# Patient Record
Sex: Female | Born: 1980 | Race: Black or African American | Hispanic: No | Marital: Single | Smoking: Current every day smoker
Health system: Southern US, Community
[De-identification: ages and names within clinical notes are randomized; demographics above are authoritative.]

## PROBLEM LIST (undated history)

## (undated) HISTORY — PX: OTHER SURGICAL HISTORY: SHX169

---
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
## 2000-04-15 ENCOUNTER — Emergency Department (HOSPITAL_COMMUNITY): Admission: EM | Admit: 2000-04-15 | Discharge: 2000-04-15 | Payer: Self-pay | Admitting: Emergency Medicine

## 2011-03-31 ENCOUNTER — Encounter: Payer: Self-pay | Admitting: Emergency Medicine

## 2011-03-31 ENCOUNTER — Emergency Department (HOSPITAL_BASED_OUTPATIENT_CLINIC_OR_DEPARTMENT_OTHER)
Admission: EM | Admit: 2011-03-31 | Discharge: 2011-03-31 | Disposition: A | Discharge status: Home / Self Care | Payer: Self-pay | Attending: Emergency Medicine | Admitting: Emergency Medicine

## 2011-03-31 DIAGNOSIS — N760 Acute vaginitis: Secondary | ICD-10-CM | POA: Insufficient documentation

## 2011-03-31 DIAGNOSIS — R109 Unspecified abdominal pain: Secondary | ICD-10-CM | POA: Insufficient documentation

## 2011-03-31 LAB — RPR: RPR Ser Ql: NONREACTIVE

## 2011-03-31 LAB — URINALYSIS, ROUTINE W REFLEX MICROSCOPIC
Glucose, UA: NEGATIVE mg/dL
Hgb urine dipstick: NEGATIVE
Ketones, ur: NEGATIVE mg/dL
Leukocytes, UA: NEGATIVE
Protein, ur: NEGATIVE mg/dL
Urobilinogen, UA: 0.2 mg/dL (ref 0.0–1.0)

## 2011-03-31 LAB — WET PREP, GENITAL: Yeast Wet Prep HPF POC: NONE SEEN

## 2011-03-31 MED ORDER — DOXYCYCLINE HYCLATE 100 MG PO CAPS: 100.0000 mg | ORAL_CAPSULE | Freq: Two times a day (BID) | ORAL | Status: AC

## 2011-03-31 MED ORDER — CEFTRIAXONE SODIUM 250 MG IJ SOLR
250.0000 mg | Freq: Once | INTRAMUSCULAR | Status: AC
  Administered 2011-03-31: 250 mg via INTRAMUSCULAR
  Filled 2011-03-31: qty 250

## 2011-03-31 MED ORDER — NAPROXEN 500 MG PO TABS: 500.0000 mg | ORAL_TABLET | Freq: Two times a day (BID) | ORAL | Status: AC

## 2011-03-31 MED ORDER — METRONIDAZOLE 500 MG PO TABS
2000.0000 mg | ORAL_TABLET | Freq: Once | ORAL | Status: AC
  Administered 2011-03-31: 2000 mg via ORAL
  Filled 2011-03-31: qty 4

## 2011-03-31 NOTE — ED Provider Notes (Addendum)
History     Chief Complaint  Patient presents with  . Abdominal Pain  . Vaginal Discharge   HPI Comments: The patient states she has had problems with recurrent vaginal discharge. She is seen in the health department in the past and they have told her to those related to either yeast infections or bacterial vaginosis. Patient comes in this evening because she is having some discharge again. She also has a swollen gland in the right inguinal region. Additionally she said a bit of a sore throat. Her boyfriend came into the emergency room this a.m. for sore throat symptoms as well  Patient is a 30 y.o. female presenting with abdominal pain and vaginal discharge. The history is provided by the patient.  Abdominal Pain The primary symptoms of the illness include abdominal pain and vaginal discharge. The primary symptoms of the illness do not include dysuria. The current episode started more than 2 days ago. The onset of the illness was gradual. The problem has not changed since onset. The vaginal discharge was first noticed more than 2 days ago. Vaginal discharge is a recurrent problem. The vaginal discharge is not associated with itching, burning or dysuria.  Symptoms associated with the illness do not include chills.  Vaginal Discharge Associated symptoms include abdominal pain.    No past medical history on file.  No past surgical history on file.  No family history on file.  History  Substance Use Topics  . Smoking status: Current Everyday Smoker  . Smokeless tobacco: Not on file  . Alcohol Use: No    OB History    Grav Para Term Preterm Abortions TAB SAB Ect Mult Living                  Review of Systems  Constitutional: Negative for chills and activity change.  HENT: Negative for congestion, facial swelling and neck stiffness.   Gastrointestinal: Positive for abdominal pain.  Genitourinary: Positive for vaginal discharge. Negative for dysuria, difficulty urinating and  genital sores.  Skin: Negative for itching.  All other systems reviewed and are negative.    Physical Exam  BP 124/80  Pulse 80  Temp(Src) 98 F (36.7 C) (Oral)  Resp 18  SpO2 99%  LMP 02/10/2011  Physical Exam  Constitutional: She appears well-developed and well-nourished. No distress.  HENT:  Head: Normocephalic and atraumatic.  Right Ear: External ear normal.  Left Ear: External ear normal.  Eyes: Conjunctivae are normal. Right eye exhibits no discharge. Left eye exhibits no discharge. No scleral icterus.  Neck: Neck supple. No tracheal deviation present.       Mild pharyngeal erythema no discharge  Cardiovascular: Normal rate, regular rhythm and intact distal pulses.   Pulmonary/Chest: Effort normal and breath sounds normal. No stridor. No respiratory distress. She has no wheezes. She has no rales.  Abdominal: Soft. Bowel sounds are normal. She exhibits no distension. There is no tenderness. There is no rebound and no guarding.  Genitourinary: Uterus normal. Guaiac stool: thick white discharge. Pelvic exam was performed with patient supine. There is no rash or tenderness on the right labia. There is no rash or tenderness on the left labia. No erythema or tenderness around the vagina. No foreign body around the vagina. No signs of injury around the vagina. Vaginal discharge found.  Musculoskeletal: She exhibits no edema and no tenderness.  Lymphadenopathy:    She has no cervical adenopathy.       Right: Inguinal adenopathy present.  Neurological: She is  alert. She has normal strength. No sensory deficit. Cranial nerve deficit:  no gross defecits noted. She exhibits normal muscle tone. She displays no seizure activity. Coordination normal.  Skin: Skin is warm and dry. No rash noted.  Psychiatric: She has a normal mood and affect.    ED Course  Procedures Labs Reviewed  WET PREP, GENITAL - Abnormal; Notable for the following:    Clue Cells, Wet Prep MANY (*)    WBC, Wet  Prep HPF POC FEW (*) BACTERIA- TOO NUMEROUS TO COUNT   All other components within normal limits  URINALYSIS, ROUTINE W REFLEX MICROSCOPIC  PREGNANCY, URINE  POCT PREGNANCY, URINE  GC/CHLAMYDIA PROBE AMP, GENITAL  RPR    MDM Patient has vaginal discharge most consistent with a vaginal candidiasis. Regarding her sore throat her friend had a sore throat last week and it has resolved. I suspect this might be a viral etiology. She does have mild inguinal lymphadenopathy on the right there are a few areas of healing hair follicles as the patient has shaved the hair in the genital region      Celene Kras, MD 03/31/11 1610  Celene Kras, MD 03/31/11 [phone]

## 2011-03-31 NOTE — ED Notes (Signed)
Pt c/o RLQ abd pain and thick white vaginal discharge.

## 2013-06-26 ENCOUNTER — Encounter (HOSPITAL_BASED_OUTPATIENT_CLINIC_OR_DEPARTMENT_OTHER): Payer: Self-pay | Admitting: Emergency Medicine

## 2013-06-26 ENCOUNTER — Emergency Department (HOSPITAL_BASED_OUTPATIENT_CLINIC_OR_DEPARTMENT_OTHER)
Admission: EM | Admit: 2013-06-26 | Discharge: 2013-06-26 | Disposition: A | Discharge status: Home / Self Care | Payer: Self-pay | Attending: Emergency Medicine | Admitting: Emergency Medicine

## 2013-06-26 DIAGNOSIS — Z3202 Encounter for pregnancy test, result negative: Secondary | ICD-10-CM | POA: Insufficient documentation

## 2013-06-26 DIAGNOSIS — N72 Inflammatory disease of cervix uteri: Secondary | ICD-10-CM | POA: Insufficient documentation

## 2013-06-26 DIAGNOSIS — F17200 Nicotine dependence, unspecified, uncomplicated: Secondary | ICD-10-CM | POA: Insufficient documentation

## 2013-06-26 LAB — URINALYSIS, ROUTINE W REFLEX MICROSCOPIC
Bilirubin Urine: NEGATIVE
Hgb urine dipstick: NEGATIVE
Ketones, ur: NEGATIVE mg/dL
Nitrite: NEGATIVE
Protein, ur: NEGATIVE mg/dL
Urobilinogen, UA: 1 mg/dL (ref 0.0–1.0)

## 2013-06-26 LAB — PREGNANCY, URINE: Preg Test, Ur: NEGATIVE

## 2013-06-26 MED ORDER — AZITHROMYCIN 250 MG PO TABS
1000.0000 mg | ORAL_TABLET | Freq: Once | ORAL | Status: AC
  Administered 2013-06-26: 1000 mg via ORAL
  Filled 2013-06-26: qty 4

## 2013-06-26 MED ORDER — IBUPROFEN 800 MG PO TABS: 800.0000 mg | ORAL_TABLET | Freq: Three times a day (TID) | ORAL | Status: DC

## 2013-06-26 MED ORDER — CEFTRIAXONE SODIUM 1 G IJ SOLR
1.0000 g | Freq: Once | INTRAMUSCULAR | Status: AC
  Administered 2013-06-26: 1 g via INTRAMUSCULAR
  Filled 2013-06-26: qty 10

## 2013-06-26 MED ORDER — LIDOCAINE HCL (PF) 1 % IJ SOLN
INTRAMUSCULAR | Status: AC
  Administered 2013-06-26: 2.1 mL
  Filled 2013-06-26: qty 5

## 2013-06-26 MED ORDER — TRAMADOL HCL 50 MG PO TABS: 50.0000 mg | ORAL_TABLET | Freq: Four times a day (QID) | ORAL | Status: DC | PRN

## 2013-06-26 NOTE — ED Notes (Signed)
Patient asked to provide clean catch urine sample & to change into gown.  Pelvic cart at bedside.

## 2013-06-26 NOTE — ED Provider Notes (Signed)
CSN: [id number]     Arrival date & time 06/26/13  1022 History   First MD Initiated Contact with Patient 06/26/13 1044     Chief Complaint  Patient presents with  . Abdominal Pain  . Vaginal Discharge   (Consider location/radiation/quality/duration/timing/severity/associated sxs/prior Treatment) HPI Comments: Patient presents to the ER for evaluation of moderate ower abdominal and pelvic pain, vaginal discharge and odor for 3 days. Patient reports that the pain is sharp, continuous. It worsens intermittently, but has not identified alleviating or exacerbating factors.  Patient is a 32 y.o. female presenting with abdominal pain and vaginal discharge.  Abdominal Pain Associated symptoms: vaginal discharge   Associated symptoms: no fever   Vaginal Discharge Associated symptoms: abdominal pain   Associated symptoms: no fever     History reviewed. No pertinent past medical history. History reviewed. No pertinent past surgical history. No family history on file. History  Substance Use Topics  . Smoking status: Current Every Day Smoker -- 0.50 packs/day    Types: Cigarettes  . Smokeless tobacco: Not on file  . Alcohol Use: Yes     Comment: occasional   OB History   Grav Para Term Preterm Abortions TAB SAB Ect Mult Living                 Review of Systems  Constitutional: Negative for fever.  Gastrointestinal: Positive for abdominal pain.  Genitourinary: Positive for vaginal discharge and pelvic pain.  All other systems reviewed and are negative.    Allergies  Iodine and Shellfish allergy  Home Medications  No current outpatient prescriptions on file. LMP 06/05/2013 Physical Exam  Constitutional: She is oriented to person, place, and time. She appears well-developed and well-nourished. No distress.  HENT:  Head: Normocephalic and atraumatic.  Right Ear: Hearing normal.  Left Ear: Hearing normal.  Nose: Nose normal.  Mouth/Throat: Oropharynx is clear and moist and  mucous membranes are normal.  Eyes: Conjunctivae and EOM are normal. Pupils are equal, round, and reactive to light.  Neck: Normal range of motion. Neck supple.  Cardiovascular: Regular rhythm, S1 normal and S2 normal.  Exam reveals no gallop and no friction rub.   No murmur heard. Pulmonary/Chest: Effort normal and breath sounds normal. No respiratory distress. She exhibits no tenderness.  Abdominal: Soft. Normal appearance and bowel sounds are normal. There is no hepatosplenomegaly. There is no tenderness. There is no rebound, no guarding, no tenderness at McBurney's point and negative Murphy's sign. No hernia.  Genitourinary: Vagina normal and uterus normal. Cervix exhibits motion tenderness. Right adnexum displays tenderness. Right adnexum displays no mass and no fullness. Left adnexum displays tenderness. Left adnexum displays no mass and no fullness.  Musculoskeletal: Normal range of motion.  Neurological: She is alert and oriented to person, place, and time. She has normal strength. No cranial nerve deficit or sensory deficit. Coordination normal. GCS eye subscore is 4. GCS verbal subscore is 5. GCS motor subscore is 6.  Skin: Skin is warm, dry and intact. No rash noted. No cyanosis.  Psychiatric: She has a normal mood and affect. Her speech is normal and behavior is normal. Thought content normal.    ED Course  Procedures (including critical care time) Labs Review Labs Reviewed  WET PREP, GENITAL - Abnormal; Notable for the following:    Clue Cells Wet Prep HPF POC MANY (*)    WBC, Wet Prep HPF POC FEW (*)    All other components within normal limits  URINALYSIS, ROUTINE W REFLEX MICROSCOPIC -  Abnormal; Notable for the following:    APPearance CLOUDY (*)    All other components within normal limits  GC/CHLAMYDIA PROBE AMP  PREGNANCY, URINE   Imaging Review No results found.  EKG Interpretation   None       MDM  Diagnosis: Cervicitis  Patient presents to the ER for  evaluation of pelvic pain and vaginal discharge. Abdominal exam is benign. No tenderness at McBurney point, no upper abdominal pain or tenderness. Pelvic exam reveals moderate cervical motion tenderness. Urinalysis unremarkable. Symptoms most consistent with cervicitis, possible pelvic inflammatory disease. Patient to be treated empirically, followup with gynecology.    Gilda Crease, MD 06/26/13 [phone]

## 2013-06-26 NOTE — ED Notes (Signed)
Pt reports vaginal d/c, odor and lower abdominal pain x 3 days

## 2013-06-27 LAB — GC/CHLAMYDIA PROBE AMP
CT Probe RNA: NEGATIVE
GC Probe RNA: NEGATIVE

## 2013-08-16 ENCOUNTER — Emergency Department (HOSPITAL_BASED_OUTPATIENT_CLINIC_OR_DEPARTMENT_OTHER)
Admission: EM | Admit: 2013-08-16 | Discharge: 2013-08-16 | Disposition: A | Discharge status: Home / Self Care | Payer: Self-pay | Attending: Emergency Medicine | Admitting: Emergency Medicine

## 2013-08-16 ENCOUNTER — Encounter (HOSPITAL_BASED_OUTPATIENT_CLINIC_OR_DEPARTMENT_OTHER): Payer: Self-pay | Admitting: Emergency Medicine

## 2013-08-16 DIAGNOSIS — Z3202 Encounter for pregnancy test, result negative: Secondary | ICD-10-CM | POA: Insufficient documentation

## 2013-08-16 DIAGNOSIS — B3731 Acute candidiasis of vulva and vagina: Secondary | ICD-10-CM | POA: Insufficient documentation

## 2013-08-16 DIAGNOSIS — A499 Bacterial infection, unspecified: Secondary | ICD-10-CM | POA: Insufficient documentation

## 2013-08-16 DIAGNOSIS — Z711 Person with feared health complaint in whom no diagnosis is made: Secondary | ICD-10-CM

## 2013-08-16 DIAGNOSIS — B9689 Other specified bacterial agents as the cause of diseases classified elsewhere: Secondary | ICD-10-CM | POA: Insufficient documentation

## 2013-08-16 DIAGNOSIS — N760 Acute vaginitis: Secondary | ICD-10-CM | POA: Insufficient documentation

## 2013-08-16 DIAGNOSIS — F17200 Nicotine dependence, unspecified, uncomplicated: Secondary | ICD-10-CM | POA: Insufficient documentation

## 2013-08-16 DIAGNOSIS — B373 Candidiasis of vulva and vagina: Secondary | ICD-10-CM

## 2013-08-16 LAB — URINALYSIS, ROUTINE W REFLEX MICROSCOPIC
Bilirubin Urine: NEGATIVE
Nitrite: NEGATIVE
Specific Gravity, Urine: 1.025 (ref 1.005–1.030)
Urobilinogen, UA: 1 mg/dL (ref 0.0–1.0)
pH: 6.5 (ref 5.0–8.0)

## 2013-08-16 LAB — PREGNANCY, URINE: Preg Test, Ur: NEGATIVE

## 2013-08-16 LAB — WET PREP, GENITAL: Trich, Wet Prep: NONE SEEN

## 2013-08-16 LAB — URINE MICROSCOPIC-ADD ON

## 2013-08-16 MED ORDER — FLUCONAZOLE 100 MG PO TABS
150.0000 mg | ORAL_TABLET | Freq: Once | ORAL | Status: AC
  Administered 2013-08-16: 150 mg via ORAL
  Filled 2013-08-16 (×2): qty 1

## 2013-08-16 MED ORDER — AZITHROMYCIN 250 MG PO TABS
1000.0000 mg | ORAL_TABLET | Freq: Once | ORAL | Status: AC
  Administered 2013-08-16: 1000 mg via ORAL
  Filled 2013-08-16: qty 4

## 2013-08-16 MED ORDER — METRONIDAZOLE 500 MG PO TABS: 500.0000 mg | ORAL_TABLET | Freq: Two times a day (BID) | ORAL | Status: DC

## 2013-08-16 MED ORDER — CEFTRIAXONE SODIUM 250 MG IJ SOLR
250.0000 mg | Freq: Once | INTRAMUSCULAR | Status: AC
  Administered 2013-08-16: 250 mg via INTRAMUSCULAR
  Filled 2013-08-16: qty 250

## 2013-08-16 NOTE — ED Notes (Signed)
Pt refused blood work. Hope Neese at bedside.

## 2013-08-16 NOTE — ED Provider Notes (Signed)
CSN: [id number]     Arrival date & time 08/16/13  1705 History   First MD Initiated Contact with Patient 08/16/13 1802     Chief Complaint  Patient presents with  . Vaginal Discharge  . Abdominal Pain   (Consider location/radiation/quality/duration/timing/severity/associated sxs/prior Treatment) Patient is a 32 y.o. female presenting with vaginal discharge and abdominal pain. The history is provided by the patient.  Vaginal Discharge Quality:  White and malodorous Severity:  Moderate Onset quality:  Gradual Duration:  4 days Timing:  Constant Progression:  Worsening Chronicity:  New Context: spontaneously   Relieved by:  Nothing Associated symptoms: abdominal pain, dysuria and urinary frequency   Associated symptoms: no dyspareunia, no fever, no nausea, no urinary incontinence and no vomiting   Abdominal Pain Associated symptoms: dysuria and vaginal discharge   Associated symptoms: no chest pain, no chills, no fever, no nausea, no shortness of breath, no sore throat and no vomiting    Denise Tran is a 32 y.o. female who presents to the ED with vaginal discharge and dysuria. She also complains of suprapubic pain. She is sexually active and uses condom for birth control. She is afraid she may have an STD. She has been with her current sex partner x 1 year. She has had trichomonas in the past.  G2 P0, SAB x 2. Last pap smear less than one year ago and was normal.   No past medical history on file. No past surgical history on file. No family history on file. History  Substance Use Topics  . Smoking status: Current Every Day Smoker -- 0.50 packs/day    Types: Cigarettes  . Smokeless tobacco: Not on file  . Alcohol Use: Yes     Comment: occasional   OB History   Grav Para Term Preterm Abortions TAB SAB Ect Mult Living                 Review of Systems  Constitutional: Negative for fever and chills.  HENT: Negative for congestion, ear pain, sore throat and trouble  swallowing.   Respiratory: Negative for shortness of breath.   Cardiovascular: Negative for chest pain.  Gastrointestinal: Positive for abdominal pain. Negative for nausea and vomiting.  Genitourinary: Positive for dysuria, urgency, frequency and vaginal discharge. Negative for bladder incontinence and dyspareunia.  Musculoskeletal: Negative for myalgias.  Skin: Negative for rash.  Allergic/Immunologic: Negative for immunocompromised state.  Neurological: Negative for headaches.  Psychiatric/Behavioral: Negative for confusion. The patient is not nervous/anxious.     Allergies  Iodine and Shellfish allergy  Home Medications  No current outpatient prescriptions on file. BP 124/77  Pulse 80  Temp(Src) 98.4 F (36.9 C) (Oral)  Resp 16  Ht 5\' 5"  (1.651 m)  Wt 180 lb (81.647 kg)  BMI 29.95 kg/m2  SpO2 100%  LMP 08/08/2013 Physical Exam  Nursing note and vitals reviewed. Constitutional: She is oriented to person, place, and time. She appears well-developed and well-nourished. No distress.  HENT:  Head: Normocephalic.  Eyes: EOM are normal.  Neck: Neck supple.  Cardiovascular: Normal rate and regular rhythm.   Pulmonary/Chest: Effort normal and breath sounds normal.  Abdominal: Soft. Bowel sounds are normal. There is no tenderness.  Genitourinary:  External genitalia with erythema consistent with yeast. Frothy malodorous discharge vaginal vault. No CMT, no adnexal tenderness, uterus without palpable enlargement.   Musculoskeletal: Normal range of motion.  Neurological: She is alert and oriented to person, place, and time. No cranial nerve deficit.  Skin: Skin is warm  and dry.  Psychiatric: She has a normal mood and affect. Her behavior is normal.   Results for orders placed during the hospital encounter of 08/16/13 (from the past 24 hour(s))  URINALYSIS, ROUTINE W REFLEX MICROSCOPIC     Status: Abnormal   Collection Time    08/16/13  5:22 PM      Result Value Range   Color,  Urine YELLOW  YELLOW   APPearance CLEAR  CLEAR   Specific Gravity, Urine 1.025  1.005 - 1.030   pH 6.5  5.0 - 8.0   Glucose, UA NEGATIVE  NEGATIVE mg/dL   Hgb urine dipstick NEGATIVE  NEGATIVE   Bilirubin Urine NEGATIVE  NEGATIVE   Ketones, ur NEGATIVE  NEGATIVE mg/dL   Protein, ur NEGATIVE  NEGATIVE mg/dL   Urobilinogen, UA 1.0  0.0 - 1.0 mg/dL   Nitrite NEGATIVE  NEGATIVE   Leukocytes, UA SMALL (*) NEGATIVE  PREGNANCY, URINE     Status: None   Collection Time    08/16/13  5:22 PM      Result Value Range   Preg Test, Ur NEGATIVE  NEGATIVE  URINE MICROSCOPIC-ADD ON     Status: Abnormal   Collection Time    08/16/13  5:22 PM      Result Value Range   Squamous Epithelial / LPF MANY (*) RARE   WBC, UA 0-2  <3 WBC/hpf   Bacteria, UA MANY (*) RARE   Urine-Other MUCOUS PRESENT    WET PREP, GENITAL     Status: Abnormal   Collection Time    08/16/13  6:53 PM      Result Value Range   Yeast Wet Prep HPF POC FEW (*) NONE SEEN   Trich, Wet Prep NONE SEEN  NONE SEEN   Clue Cells Wet Prep HPF POC MODERATE (*) NONE SEEN   WBC, Wet Prep HPF POC MODERATE (*) NONE SEEN    ED Course  Procedures  Patient refuses blood draw.   MDM  32 y.o. female with vaginal discharge, itching and burning. Concerns for possible STD's. Cultures for GC, Chlamydia and HSV sent. Patient stable for discharge without any immediate complications. I have reviewed this patient's vital signs, nurses notes, appropriate labs and imaging.  I have discussed findings with the patient and need for follow up. She voices understanding.    Medication List         metroNIDAZOLE 500 MG tablet  Commonly known as:  FLAGYL  Take 1 tablet (500 mg total) by mouth 2 (two) times daily.           [address] [place], [place] 08/17/13 [phone]

## 2013-08-16 NOTE — ED Notes (Signed)
Pt having lower abdominal pain, vaginal discharge and burning sensation x 3 days.  Pt states she has bumps on her vagina.

## 2013-08-16 NOTE — ED Notes (Signed)
rx x 1 for flagyl given with instructions for use

## 2013-08-18 LAB — GC/CHLAMYDIA PROBE AMP: CT Probe RNA: NEGATIVE

## 2013-08-18 LAB — HERPES SIMPLEX VIRUS CULTURE

## 2013-08-20 ENCOUNTER — Telehealth (HOSPITAL_BASED_OUTPATIENT_CLINIC_OR_DEPARTMENT_OTHER): Payer: Self-pay | Admitting: *Deleted

## 2013-08-20 NOTE — ED Notes (Signed)
Patient returned call, requesting flagyl vaginal gel in place of flagyl pills.  Chart reviewed with Dr. Fredderick Phenix.  Order received for Meto Gel Vagina, 1 applicator to vagina two times per day.  No refills.  Called to [facility] , [place].

## 2013-08-25 NOTE — ED Provider Notes (Signed)
Medical screening examination/treatment/procedure(s) were performed by non-physician practitioner and as supervising physician I was immediately available for consultation/collaboration.  EKG Interpretation   None         Hayzen Lorenson J Shantel Helwig, MD 08/25/13 2312 

## 2014-02-15 ENCOUNTER — Encounter (HOSPITAL_BASED_OUTPATIENT_CLINIC_OR_DEPARTMENT_OTHER): Payer: Self-pay | Admitting: Emergency Medicine

## 2014-02-15 ENCOUNTER — Emergency Department (HOSPITAL_BASED_OUTPATIENT_CLINIC_OR_DEPARTMENT_OTHER)
Admission: EM | Admit: 2014-02-15 | Discharge: 2014-02-15 | Disposition: A | Discharge status: Home / Self Care | Payer: Self-pay | Attending: Emergency Medicine | Admitting: Emergency Medicine

## 2014-02-15 DIAGNOSIS — F17200 Nicotine dependence, unspecified, uncomplicated: Secondary | ICD-10-CM | POA: Insufficient documentation

## 2014-02-15 DIAGNOSIS — Z3202 Encounter for pregnancy test, result negative: Secondary | ICD-10-CM | POA: Insufficient documentation

## 2014-02-15 DIAGNOSIS — R21 Rash and other nonspecific skin eruption: Secondary | ICD-10-CM | POA: Insufficient documentation

## 2014-02-15 DIAGNOSIS — N949 Unspecified condition associated with female genital organs and menstrual cycle: Secondary | ICD-10-CM | POA: Insufficient documentation

## 2014-02-15 DIAGNOSIS — N898 Other specified noninflammatory disorders of vagina: Secondary | ICD-10-CM | POA: Insufficient documentation

## 2014-02-15 DIAGNOSIS — R102 Pelvic and perineal pain: Secondary | ICD-10-CM

## 2014-02-15 LAB — URINALYSIS, ROUTINE W REFLEX MICROSCOPIC
Bilirubin Urine: NEGATIVE
Glucose, UA: NEGATIVE mg/dL
Hgb urine dipstick: NEGATIVE
Ketones, ur: NEGATIVE mg/dL
LEUKOCYTES UA: NEGATIVE
Nitrite: NEGATIVE
PH: 7.5 (ref 5.0–8.0)
Protein, ur: NEGATIVE mg/dL
Specific Gravity, Urine: 1.019 (ref 1.005–1.030)
UROBILINOGEN UA: 0.2 mg/dL (ref 0.0–1.0)

## 2014-02-15 LAB — WET PREP, GENITAL
Trich, Wet Prep: NONE SEEN
WBC, Wet Prep HPF POC: NONE SEEN
Yeast Wet Prep HPF POC: NONE SEEN

## 2014-02-15 LAB — PREGNANCY, URINE: PREG TEST UR: NEGATIVE

## 2014-02-15 LAB — RPR

## 2014-02-15 LAB — HIV ANTIBODY (ROUTINE TESTING W REFLEX): HIV: NONREACTIVE

## 2014-02-15 MED ORDER — AZITHROMYCIN 250 MG PO TABS
1000.0000 mg | ORAL_TABLET | Freq: Once | ORAL | Status: AC
  Administered 2014-02-15: 1000 mg via ORAL
  Filled 2014-02-15: qty 4

## 2014-02-15 MED ORDER — LIDOCAINE HCL (PF) 1 % IJ SOLN
INTRAMUSCULAR | Status: AC
  Administered 2014-02-15: 1.2 mL
  Filled 2014-02-15: qty 5

## 2014-02-15 MED ORDER — CEFTRIAXONE SODIUM 250 MG IJ SOLR
250.0000 mg | Freq: Once | INTRAMUSCULAR | Status: AC
  Administered 2014-02-15: 250 mg via INTRAMUSCULAR
  Filled 2014-02-15: qty 250

## 2014-02-15 NOTE — ED Notes (Signed)
Patient here with intermittent lower abdominal cramping x 3 days.  Reports vaginal discharge and also concerned that she has noticed some bumps on her buttocks. No nausea, no vomiting, no urinary symptoms

## 2014-02-15 NOTE — ED Notes (Signed)
Pt told charge nurse that she was going out to the waiting room to make sure her ride didn't leave and she was told she had to come back.  Pt left the Ed and then called back here for her discharge instructions.  Pt was told that she is not up for discharge yet so we do not know what her instructions will be or if there will be any prescriptions.  She sts that she will come back.  MD notified.

## 2014-02-15 NOTE — ED Provider Notes (Signed)
CSN: [id number]     Arrival date & time 02/15/14  2992 History   First MD Initiated Contact with Patient 02/15/14 1022     Chief Complaint  Patient presents with  . Abdominal Cramping     (Consider location/radiation/quality/duration/timing/severity/associated sxs/prior Treatment) HPI Comments: Pt comes in with cc of intermittent lower abd crampy pain. Pain started 3 days ago, and has no specific aggravating or relieving factors. + vaginal discharge and unprotected intercourse. No bleeding. Pt also has some bumps around her vagina and buttocks. No pain, no drainage or crusting.  Patient is a 33 y.o. female presenting with cramps. The history is provided by the patient.  Abdominal Cramping Associated symptoms: vaginal discharge   Associated symptoms: no chest pain, no dysuria, no nausea, no shortness of breath, no vaginal bleeding and no vomiting     History reviewed. No pertinent past medical history. History reviewed. No pertinent past surgical history. No family history on file. History  Substance Use Topics  . Smoking status: Current Every Day Smoker -- 0.50 packs/day    Types: Cigarettes  . Smokeless tobacco: Not on file  . Alcohol Use: Yes     Comment: occasional   OB History   Grav Para Term Preterm Abortions TAB SAB Ect Mult Living                 Review of Systems  Constitutional: Negative for activity change.  Respiratory: Negative for shortness of breath.   Cardiovascular: Negative for chest pain.  Gastrointestinal: Negative for nausea, vomiting and abdominal pain.  Genitourinary: Positive for vaginal discharge and pelvic pain. Negative for dysuria and vaginal bleeding.  Musculoskeletal: Negative for neck pain.  Skin: Positive for rash.  Neurological: Negative for headaches.      Allergies  Iodine and Shellfish allergy  Home Medications   Prior to Admission medications   Not on File   BP 143/96  Pulse 78  Temp(Src) 98 F (36.7 C)  Resp 20  Wt 178  lb (80.74 kg)  SpO2 100%  LMP 02/05/2014 Physical Exam  Nursing note and vitals reviewed. Constitutional: She is oriented to person, place, and time. She appears well-developed and well-nourished.  HENT:  Head: Normocephalic and atraumatic.  Eyes: Conjunctivae and EOM are normal. Pupils are equal, round, and reactive to light.  Neck: Normal range of motion. Neck supple.  Cardiovascular: Normal rate, regular rhythm, normal heart sounds and intact distal pulses.   No murmur heard. Pulmonary/Chest: Effort normal. No respiratory distress. She has no wheezes.  Abdominal: Soft. Bowel sounds are normal. She exhibits no distension. There is no tenderness. There is no rebound and no guarding.  Genitourinary: Vagina normal and uterus normal.  External exam - normal, no lesions Speculum exam: Pt has some white discharge, no blood Bimanual exam: Patient has no CMT, no adnexal tenderness or fullness and cervical os is closed  Neurological: She is alert and oriented to person, place, and time.  Skin: Skin is warm and dry. Rash noted.  Pt has diffuse skin lesion - wart like, or skin tag like. Painless, no bleeding/drainage.    ED Course  Procedures (including critical care time) Labs Review Labs Reviewed  WET PREP, GENITAL - Abnormal; Notable for the following:    Clue Cells Wet Prep HPF POC FEW (*)    All other components within normal limits  GC/CHLAMYDIA PROBE AMP  URINALYSIS, ROUTINE W REFLEX MICROSCOPIC  PREGNANCY, URINE  RPR  HIV ANTIBODY (ROUTINE TESTING)    Imaging Review  No results found.   EKG Interpretation None      MDM   Final diagnoses:  Pelvic pain  Skin rash in pelvic region   Pt comes in with cc of pelvic pain. GC and chlamydia tx given empirically given the discharge and high risk for STD. Abd exam is non tender, pelvic pain is intermittent.  Pt also has these lesions around the vulva and near her perineal organs. Painless. RPR tested. Hands are lesion  free. Gyne follow up requested.     Derwood KaplanAnkit Hurshel Bouillon, MD 02/15/14 1339

## 2014-02-15 NOTE — Discharge Instructions (Signed)
Please see the gynecologist for your lesions in the pelvic region. We are not sure what they are - we have tested you for a couple of other conditions - and the hospital will call you if the results are positive.  Pelvic Pain, Female Female pelvic pain can be caused by many different things and start from a variety of places. Pelvic pain refers to pain that is located in the lower half of the abdomen and between your hips. The pain may occur over a short period of time (acute) or may be reoccurring (chronic). The cause of pelvic pain may be related to disorders affecting the female reproductive organs (gynecologic), but it may also be related to the bladder, kidney stones, an intestinal complication, or muscle or skeletal problems. Getting help right away for pelvic pain is important, especially if there has been severe, sharp, or a sudden onset of unusual pain. It is also important to get help right away because some types of pelvic pain can be life threatening.  CAUSES  Below are only some of the causes of pelvic pain. The causes of pelvic pain can be in one of several categories.   Gynecologic.  Pelvic inflammatory disease.  Sexually transmitted infection.  Ovarian cyst or a twisted ovarian ligament (ovarian torsion).  Uterine lining that grows outside the uterus (endometriosis).  Fibroids, cysts, or tumors.  Ovulation.  Pregnancy.  Pregnancy that occurs outside the uterus (ectopic pregnancy).  Miscarriage.  Labor.  Abruption of the placenta or ruptured uterus.  Infection.  Uterine infection (endometritis).  Bladder infection.  Diverticulitis.  Miscarriage related to a uterine infection (septic abortion).  Bladder.  Inflammation of the bladder (cystitis).  Kidney stone(s).  Gastrointenstinal.  Constipation.  Diverticulitis.  Neurologic.  Trauma.  Feeling pelvic pain because of mental or emotional causes (psychosomatic).  Cancers of the bowel or  pelvis. EVALUATION  Your caregiver will want to take a careful history of your concerns. This includes recent changes in your health, a careful gynecologic history of your periods (menses), and a sexual history. Obtaining your family history and medical history is also important. Your caregiver may suggest a pelvic exam. A pelvic exam will help identify the location and severity of the pain. It also helps in the evaluation of which organ system may be involved. In order to identify the cause of the pelvic pain and be properly treated, your caregiver may order tests. These tests may include:   A pregnancy test.  Pelvic ultrasonography.  An X-ray exam of the abdomen.  A urinalysis or evaluation of vaginal discharge.  Blood tests. HOME CARE INSTRUCTIONS   Only take over-the-counter or prescription medicines for pain, discomfort, or fever as directed by your caregiver.   Rest as directed by your caregiver.   Eat a balanced diet.   Drink enough fluids to make your urine clear or pale yellow, or as directed.   Avoid sexual intercourse if it causes pain.   Apply warm or cold compresses to the lower abdomen depending on which one helps the pain.   Avoid stressful situations.   Keep a journal of your pelvic pain. Write down when it started, where the pain is located, and if there are things that seem to be associated with the pain, such as food or your menstrual cycle.  Follow up with your caregiver as directed.  SEEK MEDICAL CARE IF:  Your medicine does not help your pain.  You have abnormal vaginal discharge. SEEK IMMEDIATE MEDICAL CARE IF:  You have heavy bleeding from the vagina.   Your pelvic pain increases.   You feel lightheaded or faint.   You have chills.   You have pain with urination or blood in your urine.   You have uncontrolled diarrhea or vomiting.   You have a fever or persistent symptoms for more than 3 days.  You have a fever and your  symptoms suddenly get worse.   You are being physically or sexually abused.  MAKE SURE YOU:  Understand these instructions.  Will watch your condition.  Will get help if you are not doing well or get worse. Document Released: 07/25/2004 Document Revised: 02/27/2012 Document Reviewed: 12/18/2011 [facility] Patient Information 2014 [place], [place].

## 2014-02-16 LAB — GC/CHLAMYDIA PROBE AMP
CT Probe RNA: NEGATIVE
GC PROBE AMP APTIMA: NEGATIVE

## 2015-04-22 ENCOUNTER — Emergency Department (HOSPITAL_BASED_OUTPATIENT_CLINIC_OR_DEPARTMENT_OTHER)
Admission: EM | Admit: 2015-04-22 | Discharge: 2015-04-22 | Disposition: A | Discharge status: Home / Self Care | Payer: Self-pay | Attending: Emergency Medicine | Admitting: Emergency Medicine

## 2015-04-22 ENCOUNTER — Encounter (HOSPITAL_BASED_OUTPATIENT_CLINIC_OR_DEPARTMENT_OTHER): Payer: Self-pay | Admitting: Emergency Medicine

## 2015-04-22 DIAGNOSIS — R238 Other skin changes: Secondary | ICD-10-CM | POA: Insufficient documentation

## 2015-04-22 DIAGNOSIS — Z720 Tobacco use: Secondary | ICD-10-CM | POA: Insufficient documentation

## 2015-04-22 DIAGNOSIS — R51 Headache: Secondary | ICD-10-CM | POA: Insufficient documentation

## 2015-04-22 NOTE — ED Notes (Signed)
Patient preparing for discharge. 

## 2015-04-22 NOTE — ED Provider Notes (Signed)
CSN: [id number]     Arrival date & time 04/22/15  4098 History   First MD Initiated Contact with Patient 04/22/15 1014     Chief Complaint  Patient presents with  . Abscess     (Consider location/radiation/quality/duration/timing/severity/associated sxs/prior Treatment) Patient is a 34 y.o. female presenting with abscess.  Abscess Location:  Face Facial abscess location: Right temple. Size:  2mm Abscess quality: painful   Abscess quality: not draining, no fluctuance and no itching   Red streaking: no   Duration:  8 months Progression:  Unchanged Pain details:    Severity:  Moderate   Timing:  Constant   Progression:  Worsening Context comment:  Patient attempted to pop the lesion. Relieved by:  Nothing Worsened by:  Draining/squeezing (Manipulating the lesion) Associated symptoms: headaches (Associated with trying to squeeze the lesion)   Associated symptoms: no fever     History reviewed. No pertinent past medical history. Past Surgical History  Procedure Laterality Date  . Cyst removed from breast     No family history on file. Social History  Substance Use Topics  . Smoking status: Current Every Day Smoker -- 0.50 packs/day    Types: Cigarettes  . Smokeless tobacco: None  . Alcohol Use: No     Comment: occasional   OB History    No data available     Review of Systems  Constitutional: Negative for fever.  Neurological: Positive for headaches (Associated with trying to squeeze the lesion).  All other systems reviewed and are negative.     Allergies  Iodine and Shellfish allergy  Home Medications   Prior to Admission medications   Not on File   BP 115/65 mmHg  Pulse 90  Temp(Src) 98.1 F (36.7 C) (Oral)  Resp 18  Ht  (1.651 m)  Wt 165 lb (74.844 kg)  BMI 27.46 kg/m2  SpO2 100% Physical Exam  Constitutional: She is oriented to person, place, and time. She appears well-developed and well-nourished. No distress.  HENT:  Head:  Normocephalic and atraumatic.    Eyes: Conjunctivae are normal. No scleral icterus.  Neck: Neck supple.  Cardiovascular: Normal rate and intact distal pulses.   Pulmonary/Chest: Effort normal. No stridor. No respiratory distress.  Abdominal: Normal appearance. She exhibits no distension.  Neurological: She is alert and oriented to person, place, and time.  Skin: Skin is warm and dry. No rash noted.  Psychiatric: She has a normal mood and affect. Her behavior is normal.  Nursing note and vitals reviewed.   ED Course  Procedures (including critical care time) Labs Review Labs Reviewed - No data to display  Imaging Review No results found.   EKG Interpretation None      MDM   Final diagnoses:  Papule    Small skin lesion. Recommended outpatient treatment.    Blake Divine, MD 04/22/15 1050

## 2015-04-22 NOTE — Discharge Instructions (Signed)
Use over-the-counter skin ointments. Do not attempt to bust or manipulate the lesion. Return if swelling worsens, you develop fevers, or other concerning symptoms develop.

## 2015-04-22 NOTE — ED Notes (Signed)
?   Small beginning of an abscess rt temporal area

## 2015-05-19 ENCOUNTER — Emergency Department (HOSPITAL_BASED_OUTPATIENT_CLINIC_OR_DEPARTMENT_OTHER)
Admission: EM | Admit: 2015-05-19 | Discharge: 2015-05-19 | Disposition: A | Discharge status: Home / Self Care | Payer: No Typology Code available for payment source | Attending: Emergency Medicine | Admitting: Emergency Medicine

## 2015-05-19 ENCOUNTER — Encounter (HOSPITAL_BASED_OUTPATIENT_CLINIC_OR_DEPARTMENT_OTHER): Payer: Self-pay | Admitting: Emergency Medicine

## 2015-05-19 DIAGNOSIS — Z720 Tobacco use: Secondary | ICD-10-CM | POA: Insufficient documentation

## 2015-05-19 DIAGNOSIS — S299XXA Unspecified injury of thorax, initial encounter: Secondary | ICD-10-CM | POA: Diagnosis not present

## 2015-05-19 DIAGNOSIS — Y9389 Activity, other specified: Secondary | ICD-10-CM | POA: Diagnosis not present

## 2015-05-19 DIAGNOSIS — Y998 Other external cause status: Secondary | ICD-10-CM | POA: Diagnosis not present

## 2015-05-19 DIAGNOSIS — S199XXA Unspecified injury of neck, initial encounter: Secondary | ICD-10-CM | POA: Diagnosis present

## 2015-05-19 DIAGNOSIS — R110 Nausea: Secondary | ICD-10-CM | POA: Insufficient documentation

## 2015-05-19 DIAGNOSIS — Y92481 Parking lot as the place of occurrence of the external cause: Secondary | ICD-10-CM | POA: Insufficient documentation

## 2015-05-19 MED ORDER — IBUPROFEN 600 MG PO TABS: 600.0000 mg | ORAL_TABLET | Freq: Four times a day (QID) | ORAL | Status: DC | PRN

## 2015-05-19 MED ORDER — DIAZEPAM 5 MG PO TABS: 5.0000 mg | ORAL_TABLET | Freq: Four times a day (QID) | ORAL | Status: DC | PRN
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
## 2015-05-19 NOTE — Discharge Instructions (Signed)
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
## 2015-06-29 ENCOUNTER — Emergency Department (HOSPITAL_BASED_OUTPATIENT_CLINIC_OR_DEPARTMENT_OTHER)
Admission: EM | Admit: 2015-06-29 | Discharge: 2015-06-30 | Disposition: A | Discharge status: Home / Self Care | Payer: Self-pay | Attending: Emergency Medicine | Admitting: Emergency Medicine

## 2015-06-29 ENCOUNTER — Encounter (HOSPITAL_BASED_OUTPATIENT_CLINIC_OR_DEPARTMENT_OTHER): Payer: Self-pay

## 2015-06-29 DIAGNOSIS — F17210 Nicotine dependence, cigarettes, uncomplicated: Secondary | ICD-10-CM | POA: Insufficient documentation

## 2015-06-29 DIAGNOSIS — N3001 Acute cystitis with hematuria: Secondary | ICD-10-CM | POA: Insufficient documentation

## 2015-06-29 DIAGNOSIS — Z3202 Encounter for pregnancy test, result negative: Secondary | ICD-10-CM | POA: Insufficient documentation

## 2015-06-29 DIAGNOSIS — R202 Paresthesia of skin: Secondary | ICD-10-CM | POA: Insufficient documentation

## 2015-06-29 LAB — URINALYSIS, ROUTINE W REFLEX MICROSCOPIC
BILIRUBIN URINE: NEGATIVE
Glucose, UA: NEGATIVE mg/dL
KETONES UR: NEGATIVE mg/dL
Nitrite: NEGATIVE
PH: 5.5 (ref 5.0–8.0)
Protein, ur: NEGATIVE mg/dL
Specific Gravity, Urine: 1.021 (ref 1.005–1.030)
UROBILINOGEN UA: 1 mg/dL (ref 0.0–1.0)

## 2015-06-29 LAB — URINE MICROSCOPIC-ADD ON

## 2015-06-29 LAB — PREGNANCY, URINE: Preg Test, Ur: NEGATIVE

## 2015-06-29 MED ORDER — IBUPROFEN 800 MG PO TABS: 800.0000 mg | ORAL_TABLET | Freq: Three times a day (TID) | ORAL | Status: DC | PRN

## 2015-06-29 MED ORDER — NITROFURANTOIN MONOHYD MACRO 100 MG PO CAPS: 100.0000 mg | ORAL_CAPSULE | Freq: Two times a day (BID) | ORAL | Status: DC

## 2015-06-29 MED ORDER — IBUPROFEN 800 MG PO TABS
800.0000 mg | ORAL_TABLET | Freq: Once | ORAL | Status: AC
  Administered 2015-06-29: 800 mg via ORAL
  Filled 2015-06-29: qty 1

## 2015-06-29 MED ORDER — NITROFURANTOIN MONOHYD MACRO 100 MG PO CAPS
100.0000 mg | ORAL_CAPSULE | Freq: Once | ORAL | Status: AC
  Administered 2015-06-29: 100 mg via ORAL
  Filled 2015-06-29: qty 1

## 2015-06-29 NOTE — Discharge Instructions (Signed)

## 2015-06-29 NOTE — ED Provider Notes (Signed)
CSN: [id number]     Arrival date & time 06/29/15  2201 History  By signing my name below, I, Gwenyth Oberatherine Macek, attest that this documentation has been prepared under the direction and in the presence of Paula LibraJohn Abdoulie Tierce, MD.  Electronically Signed: Gwenyth Oberatherine Macek, ED Scribe. 06/29/2015. 11:49 PM.   Chief Complaint  Patient presents with  . Hematuria   The history is provided by the patient. No language interpreter was used.    HPI Comments: Denise Tran is a 34 y.o. female who presents to the Emergency Department complaining of intermittent, mild hematuria that started 2-3 days ago. She has associated tingling with urination and mild suprapubic pain as associated symptoms. Pt denies vaginal bleeding, vaginal discharge, urinary frequency, back pain, vomiting, diarrhea, fever and chills.   History reviewed. No pertinent past medical history. Past Surgical History  Procedure Laterality Date  . Cyst removed from breast     No family history on file. Social History  Substance Use Topics  . Smoking status: Current Every Day Smoker -- 0.50 packs/day    Types: Cigarettes  . Smokeless tobacco: None  . Alcohol Use: No   OB History    No data available     Review of Systems 10 Systems reviewed and all are negative for acute change except as noted in the HPI.   Allergies  Iodine and Shellfish allergy  Home Medications   Prior to Admission medications   Medication Sig Start Date End Date Taking? Authorizing Provider  ibuprofen (ADVIL,MOTRIN) 800 MG tablet Take 1 tablet (800 mg total) by mouth every 8 (eight) hours as needed (for pain). 06/29/15   Aidian Salomon, MD  nitrofurantoin, macrocrystal-monohydrate, (MACROBID) 100 MG capsule Take 1 capsule (100 mg total) by mouth 2 (two) times daily. X 7 days 06/29/15   Paula LibraJohn Jasiyah Paulding, MD   BP 121/80 mmHg  Pulse 89  Temp(Src) 98.3 F (36.8 C) (Oral)  Resp 16  Ht 5\' 5"  (1.651 m)  Wt 165 lb (74.844 kg)  BMI 27.46 kg/m2  SpO2 100%  LMP 06/19/2015    Physical Exam General: Well-developed, well-nourished female in no acute distress; appearance consistent with age of record HENT: normocephalic; atraumatic Eyes: pupils equal, round and reactive to light; extraocular muscles intact Neck: supple Heart: regular rate and rhythm Lungs: clear to auscultation bilaterally Abdomen: soft; nondistended; mild suprapubic tenderness; no masses or hepatosplenomegaly; bowel sounds present Extremities: No deformity; full range of motion; pulses normal Neurologic: Awake, alert and oriented; motor function intact in all extremities and symmetric; no facial droop Skin: Warm and dry Psychiatric: Normal mood and affect  ED Course  Procedures  DIAGNOSTIC STUDIES: Oxygen Saturation is 100% on RA, normal by my interpretation.    COORDINATION OF CARE: 11:22 PM Discussed treatment plan with pt. Pt agreed to plan.    MDM   Nursing notes and vitals signs, including pulse oximetry, reviewed.  Summary of this visit's results, reviewed by myself:  Labs:  Results for orders placed or performed during the hospital encounter of 06/29/15 (from the past 24 hour(s))  Urinalysis, Routine w reflex microscopic ([facility])     Status: Abnormal   Collection Time: 06/29/15 10:05 PM  Result Value Ref Range   Color, Urine YELLOW YELLOW   APPearance CLEAR CLEAR   Specific Gravity, Urine 1.021 1.005 - 1.030   pH 5.5 5.0 - 8.0   Glucose, UA NEGATIVE NEGATIVE mg/dL   Hgb urine dipstick LARGE (A) NEGATIVE   Bilirubin Urine NEGATIVE NEGATIVE  Ketones, ur NEGATIVE NEGATIVE mg/dL   Protein, ur NEGATIVE NEGATIVE mg/dL   Urobilinogen, UA 1.0 0.0 - 1.0 mg/dL   Nitrite NEGATIVE NEGATIVE   Leukocytes, UA TRACE (A) NEGATIVE  Pregnancy, urine     Status: None   Collection Time: 06/29/15 10:05 PM  Result Value Ref Range   Preg Test, Ur NEGATIVE NEGATIVE  Urine microscopic-add on     Status: Abnormal   Collection Time: 06/29/15 10:05 PM  Result Value Ref Range    Squamous Epithelial / LPF FEW (A) RARE   WBC, UA 3-6 <3 WBC/hpf   RBC / HPF 11-20 <3 RBC/hpf   Bacteria, UA FEW (A) RARE   Urine-Other MUCOUS PRESENT    We'll treat for early hemorrhagic cystitis.   Final diagnoses:  Acute cystitis with hematuria   I personally performed the services described in this documentation, which was scribed in my presence. The recorded information has been reviewed and is accurate.   Paula Libra, MD 06/29/15 [phone]

## 2015-06-29 NOTE — ED Notes (Signed)
Hematuria x 2-3 days

## 2015-07-01 LAB — URINE CULTURE

## 2016-03-01 ENCOUNTER — Encounter (HOSPITAL_BASED_OUTPATIENT_CLINIC_OR_DEPARTMENT_OTHER): Payer: Self-pay | Admitting: Emergency Medicine

## 2016-03-01 ENCOUNTER — Emergency Department (HOSPITAL_BASED_OUTPATIENT_CLINIC_OR_DEPARTMENT_OTHER)
Admission: EM | Admit: 2016-03-01 | Discharge: 2016-03-01 | Disposition: A | Discharge status: Home / Self Care | Payer: No Typology Code available for payment source | Attending: Emergency Medicine | Admitting: Emergency Medicine

## 2016-03-01 DIAGNOSIS — N730 Acute parametritis and pelvic cellulitis: Secondary | ICD-10-CM

## 2016-03-01 DIAGNOSIS — M549 Dorsalgia, unspecified: Secondary | ICD-10-CM | POA: Insufficient documentation

## 2016-03-01 DIAGNOSIS — R300 Dysuria: Secondary | ICD-10-CM

## 2016-03-01 DIAGNOSIS — F17210 Nicotine dependence, cigarettes, uncomplicated: Secondary | ICD-10-CM | POA: Insufficient documentation

## 2016-03-01 DIAGNOSIS — B9689 Other specified bacterial agents as the cause of diseases classified elsewhere: Secondary | ICD-10-CM

## 2016-03-01 DIAGNOSIS — N72 Inflammatory disease of cervix uteri: Secondary | ICD-10-CM

## 2016-03-01 DIAGNOSIS — R1030 Lower abdominal pain, unspecified: Secondary | ICD-10-CM

## 2016-03-01 DIAGNOSIS — N760 Acute vaginitis: Secondary | ICD-10-CM | POA: Insufficient documentation

## 2016-03-01 LAB — COMPREHENSIVE METABOLIC PANEL
ALK PHOS: 53 U/L (ref 38–126)
ALT: 12 U/L — AB (ref 14–54)
AST: 20 U/L (ref 15–41)
Albumin: 4.3 g/dL (ref 3.5–5.0)
Anion gap: 6 (ref 5–15)
BUN: 9 mg/dL (ref 6–20)
CO2: 26 mmol/L (ref 22–32)
CREATININE: 0.97 mg/dL (ref 0.44–1.00)
Calcium: 9 mg/dL (ref 8.9–10.3)
Chloride: 105 mmol/L (ref 101–111)
GFR calc Af Amer: >60 mL/min (ref >60)
GFR calc non Af Amer: >60 mL/min (ref >60)
GLUCOSE: 84 mg/dL (ref 65–99)
Potassium: 3.5 mmol/L (ref 3.5–5.1)
SODIUM: 137 mmol/L (ref 135–145)
Total Bilirubin: 0.6 mg/dL (ref 0.3–1.2)
Total Protein: 7.4 g/dL (ref 6.5–8.1)

## 2016-03-01 LAB — URINALYSIS, ROUTINE W REFLEX MICROSCOPIC
Bilirubin Urine: NEGATIVE
GLUCOSE, UA: NEGATIVE mg/dL
KETONES UR: NEGATIVE mg/dL
Leukocytes, UA: NEGATIVE
Nitrite: NEGATIVE
Protein, ur: NEGATIVE mg/dL
Specific Gravity, Urine: 1.01 (ref 1.005–1.030)
pH: 6 (ref 5.0–8.0)

## 2016-03-01 LAB — WET PREP, GENITAL
Sperm: NONE SEEN
Trich, Wet Prep: NONE SEEN
Yeast Wet Prep HPF POC: NONE SEEN

## 2016-03-01 LAB — URINE MICROSCOPIC-ADD ON: WBC, UA: NONE SEEN WBC/hpf (ref 0–5)

## 2016-03-01 LAB — LIPASE, BLOOD: Lipase: 26 U/L (ref 11–51)

## 2016-03-01 LAB — CBC
HCT: 38.9 % (ref 36.0–46.0)
Hemoglobin: 13.4 g/dL (ref 12.0–15.0)
MCH: 30.4 pg (ref 26.0–34.0)
MCHC: 34.4 g/dL (ref 30.0–36.0)
MCV: 88.2 fL (ref 78.0–100.0)
PLATELETS: 232 10*3/uL (ref 150–400)
RBC: 4.41 MIL/uL (ref 3.87–5.11)
RDW: 14.2 % (ref 11.5–15.5)
WBC: 6.2 10*3/uL (ref 4.0–10.5)

## 2016-03-01 LAB — PREGNANCY, URINE: Preg Test, Ur: NEGATIVE

## 2016-03-01 MED ORDER — CEFTRIAXONE SODIUM 250 MG IJ SOLR: 250.0000 mg | Freq: Once | INTRAMUSCULAR | Status: DC

## 2016-03-01 MED ORDER — KETOROLAC TROMETHAMINE 30 MG/ML IJ SOLN
30.0000 mg | Freq: Once | INTRAMUSCULAR | Status: AC
  Administered 2016-03-01: 30 mg via INTRAVENOUS
  Filled 2016-03-01: qty 1

## 2016-03-01 MED ORDER — AZITHROMYCIN 250 MG PO TABS
1000.0000 mg | ORAL_TABLET | Freq: Once | ORAL | Status: AC
  Administered 2016-03-01: 1000 mg via ORAL
  Filled 2016-03-01: qty 4

## 2016-03-01 MED ORDER — FLUCONAZOLE 150 MG PO TABS: 150.0000 mg | ORAL_TABLET | Freq: Once | ORAL | Status: DC

## 2016-03-01 MED ORDER — NAPROXEN 500 MG PO TABS: 500.0000 mg | ORAL_TABLET | Freq: Two times a day (BID) | ORAL | Status: DC | PRN

## 2016-03-01 MED ORDER — DOXYCYCLINE HYCLATE 100 MG PO CAPS: 100.0000 mg | ORAL_CAPSULE | Freq: Two times a day (BID) | ORAL | Status: DC

## 2016-03-01 MED ORDER — METRONIDAZOLE 500 MG PO TABS: 500.0000 mg | ORAL_TABLET | Freq: Two times a day (BID) | ORAL | Status: DC

## 2016-03-01 MED ORDER — DEXTROSE 5 % IV SOLN
1.0000 g | Freq: Once | INTRAVENOUS | Status: AC
  Administered 2016-03-01: 1 g via INTRAVENOUS
  Filled 2016-03-01: qty 10

## 2016-03-01 MED FILL — NAPROXEN 500 MG TABLET: 500 | 10 days supply | Qty: 20 | Fill #0

## 2016-03-01 MED FILL — FLUCONAZOLE 150 MG TABLET: 150 | 1 days supply | Qty: 1 | Fill #0

## 2016-03-01 MED FILL — metroNIDAZOLE 500 MG TABS: 500 | 7 days supply | Qty: 14 | Fill #0

## 2016-03-01 MED FILL — DOXYCYCLINE HYC 100 MG CAP: 100 | 14 days supply | Qty: 28 | Fill #0

## 2016-03-01 NOTE — Discharge Instructions (Signed)
You have been treated for gonorrhea and chlamydia in the ER but the hospital will call you if lab is positive. DO NOT ENGAGE IN SEXUAL ACTIVITY UNTIL YOU FIND OUT ABOUT YOUR RESULTS, THIS WILL INVALIDATE YOUR TREATMENT HERE IF YOU ENGAGE IN SEX BEFORE KNOWING YOUR RESULTS AND HAVING PARTNERS TESTED AND TREATED. ALL PARTNERS MUST BE TESTED AND TREATED FOR STD'S. NO SEXUAL INTERCOURSE FOR AT LEAST 10 DAYS AFTER TODAY'S VISIT. ALWAYS USE CONDOMS WHEN ENGAGING IN INTERCOURSE.   Your vaginal swabs revealed bacterial vaginosis, take flagyl as directed and avoid alcohol while you're taking this medication. You are being treated for pelvic inflammatory disease, which is inflammation or infection of the pelvic organs. Take the antibiotic Doxycycline as directed, and avoid direct sunlight while taking this medication as it can cause you to be sensitive to the sun. Use tylenol and naprosyn as directed as needed for pain. Stay well hydrated.  [facility] for future STD concerns or screenings. This is the recommendation by the CDC for people with multiple sexual partners or history of STDs. Follow up with the women's outpatient clinic in 1 week for recheck of symptoms and for ongoing management of your recurrent pelvic pain. [facility] for changes or worsening symptoms.     Pelvic Inflammatory Disease Pelvic inflammatory disease (PID) is an infection in some or all of the female organs. PID can be in the uterus, ovaries, fallopian tubes, or the surrounding tissues that are inside the lower belly area (pelvis). PID can lead to lasting problems if it is not treated. To check for this disease, your doctor may:  Do a physical exam.  Do blood tests, urine tests, or a pregnancy test.  Look at your vaginal discharge.  Do tests to look inside the pelvis.  Test you for other infections. HOME CARE  Take  over-the-counter and prescription medicines only as told by your doctor.  If you were prescribed an antibiotic medicine, take it as told by your doctor. Do not stop taking it even if you start to feel better.  Do not have sex until treatment is done or as told by your doctor.  Tell your sex partner if you have PID. Your partner may need to be treated.  Keep all follow-up visits as told by your doctor. This is important.  Your doctor may test you for infection again 3 months after you are treated. GET HELP IF:  You have more fluid (discharge) coming from your vagina or fluid that is not normal.  Your pain does not improve.  You throw up (vomit).  You have a fever.  You cannot take your medicines.  Your partner has a sexually transmitted disease (STD).  You have pain when you pee (urinate). GET HELP RIGHT AWAY IF:  You have more belly (abdominal) or lower belly pain.  You have chills.  You are not better after 72 hours.   This information is not intended to replace advice given to you by your health care provider. Make sure you discuss any questions you have with your health care provider.   Document Released: 11/24/2008 Document Revised: 05/19/2015 Document Reviewed: 10/05/2014 Elsevier Interactive Patient Education 2016 [employer]   Bacterial Vaginosis Bacterial vaginosis is an infection of the vagina. It happens when too many germs (bacteria) grow in the vagina. Having this infection puts you at risk for getting other infections from sex. Treating this infection can  help lower your risk for other infections, such as:   Chlamydia.  Gonorrhea.  HIV.  Herpes. HOME CARE  Take your medicine as told by your doctor.  Finish your medicine even if you start to feel better.  Tell your sex partner that you have an infection. They should see their doctor for treatment.  During treatment:  Avoid sex or use condoms correctly.  Do not douche.  Do not drink  alcohol unless your doctor tells you it is ok.  Do not breastfeed unless your doctor tells you it is ok. GET HELP IF:  You are not getting better after 3 days of treatment.  You have more grey fluid (discharge) coming from your vagina than before.  You have more pain than before.  You have a fever. MAKE SURE YOU:   Understand these instructions.  Will watch your condition.  Will get help right away if you are not doing well or get worse.   This information is not intended to replace advice given to you by your health care provider. Make sure you discuss any questions you have with your health care provider.   Document Released: 06/06/2008 Document Revised: 09/18/2014 Document Reviewed: 04/09/2013 Elsevier Interactive Patient Education 2016 [employer] Cervicitis Cervicitis is a soreness and puffiness (inflammation) of the cervix.  HOME CARE  Do not have sex (intercourse) until your doctor says it is okay.  Do not have sex until your partner is treated or as told by your doctor.  Take your antibiotic medicine as told. Finish it even if you start to feel better. GET HELP IF:   Your symptoms that brought you to the doctor come back.  You have a fever. MAKE SURE YOU:   Understand these instructions.  Will watch your condition.  Will get help right away if you are not doing well or get worse.   This information is not intended to replace advice given to you by your health care provider. Make sure you discuss any questions you have with your health care provider.   Document Released: 06/06/2008 Document Revised: 09/02/2013 Document Reviewed: 02/19/2013 Elsevier Interactive Patient Education [employer]

## 2016-03-01 NOTE — ED Provider Notes (Signed)
CSN: [id number]     Arrival date & time 03/01/16  4098 History   First MD Initiated Contact with Patient 03/01/16 0902     Chief Complaint  Patient presents with  . Abdominal Pain     (Consider location/radiation/quality/duration/timing/severity/associated sxs/prior Treatment) HPI Comments: Denise Tran is a 35 y.o. female with a PMHx of recurrent UTIs, who presents to the ED with complaints of suprapubic abdominal pain 1 week. She describes the pain as 10/10 constant sharp pain in the suprapubic region, radiating into her lower back, worse with walking and standing, and with no treatments tried prior to arrival. Associated symptoms include dysuria, increased urinary frequency, and malodorous urine.  She denies any fevers, chills, chest pain, shortness breath, nausea, vomiting, diarrhea, constipation, obstipation, melena, hematochezia, vaginal bleeding or discharge, hematuria, numbness, tingling, weakness, recent travel, sick contacts, suspicious food intake, alcohol use, NSAID use, or prior abdominal surgeries. LMP was 02/27/16. She is sexually active with 2 female partners in the last year, both unprotected. She follows up with the health department for her regular care, and has been told she has a "yeast and bacterial infection" every time she goes there. Chart review reveals several visits to the ER over the last several years with similar symptoms, has been diagnosed with PID or cervicitis/cystitis during those visits. Pt endorses that this has been a recurrent problem for multiple years.   Patient is a 35 y.o. female presenting with abdominal pain. The history is provided by the patient and medical records. No language interpreter was used.  Abdominal Pain Pain location:  Suprapubic Pain quality: sharp   Pain radiates to:  Back Pain severity:  Severe Onset quality:  Gradual Duration:  1 week Timing:  Constant Progression:  Worsening Chronicity:  Recurrent Context: not recent travel, not  sick contacts and not suspicious food intake   Relieved by:  None tried Worsened by:  Movement Ineffective treatments:  None tried Associated symptoms: dysuria   Associated symptoms: no chest pain, no chills, no constipation, no diarrhea, no fever, no flatus, no hematochezia, no hematuria, no melena, no nausea, no shortness of breath, no vaginal bleeding, no vaginal discharge and no vomiting   Risk factors: no alcohol abuse, has not had multiple surgeries, no NSAID use and not pregnant     History reviewed. No pertinent past medical history. Past Surgical History  Procedure Laterality Date  . Cyst removed from breast     History reviewed. No pertinent family history. Social History  Substance Use Topics  . Smoking status: Current Every Day Smoker -- 0.50 packs/day    Types: Cigarettes  . Smokeless tobacco: None  . Alcohol Use: No   OB History    No data available     Review of Systems  Constitutional: Negative for fever and chills.  Respiratory: Negative for shortness of breath.   Cardiovascular: Negative for chest pain.  Gastrointestinal: Positive for abdominal pain. Negative for nausea, vomiting, diarrhea, constipation, blood in stool, melena, hematochezia, anal bleeding and flatus.  Genitourinary: Positive for dysuria and frequency. Negative for hematuria, vaginal bleeding and vaginal discharge.       +malodorous urine  Musculoskeletal: Positive for back pain (radiating from abdomen). Negative for myalgias and arthralgias.  Skin: Negative for color change.  Allergic/Immunologic: Negative for immunocompromised state.  Neurological: Negative for weakness and numbness.  Psychiatric/Behavioral: Negative for confusion.   10 Systems reviewed and are negative for acute change except as noted in the HPI.    Allergies  Iodine and  Shellfish allergy  Home Medications   Prior to Admission medications   Medication Sig Start Date End Date Taking? Authorizing Provider  ibuprofen  (ADVIL,MOTRIN) 800 MG tablet Take 1 tablet (800 mg total) by mouth every 8 (eight) hours as needed (for pain). 06/29/15   John Molpus, MD  nitrofurantoin, macrocrystal-monohydrate, (MACROBID) 100 MG capsule Take 1 capsule (100 mg total) by mouth 2 (two) times daily. X 7 days 06/29/15   John Molpus, MD   BP 132/96 mmHg  Pulse 74  Temp(Src) 98.9 F (37.2 C) (Oral)  Resp 18  Ht  (1.651 m)  Wt 75.297 kg  BMI 27.62 kg/m2  SpO2 100%  LMP 02/27/2016 (Exact Date) Physical Exam  Constitutional: She is oriented to person, place, and time. Vital signs are normal. She appears well-developed and well-nourished.  Non-toxic appearance. No distress.  Afebrile, nontoxic, NAD  HENT:  Head: Normocephalic and atraumatic.  Mouth/Throat: Oropharynx is clear and moist and mucous membranes are normal.  Eyes: Conjunctivae and EOM are normal. Right eye exhibits no discharge. Left eye exhibits no discharge.  Neck: Normal range of motion. Neck supple.  Cardiovascular: Normal rate, regular rhythm, normal heart sounds and intact distal pulses.  Exam reveals no gallop and no friction rub.   No murmur heard. Pulmonary/Chest: Effort normal and breath sounds normal. No respiratory distress. She has no decreased breath sounds. She has no wheezes. She has no rhonchi. She has no rales.  Abdominal: Soft. Normal appearance and bowel sounds are normal. She exhibits no distension. There is tenderness in the suprapubic area. There is CVA tenderness. There is no rigidity, no rebound, no guarding, no tenderness at McBurney's point and negative Murphy's sign.    Soft, nondistended, +BS throughout, with mild suprapubic TTP, no r/g/r, neg murphy's, neg mcburney's, and with very mild L sided CVA TTP   Genitourinary: Uterus normal. Pelvic exam was performed with patient supine. There is no rash, tenderness or lesion on the right labia. There is no rash, tenderness or lesion on the left labia. Cervix exhibits motion tenderness and  discharge. Cervix exhibits no friability. Right adnexum displays no mass, no tenderness and no fullness. Left adnexum displays no mass, no tenderness and no fullness. No erythema, tenderness or bleeding in the vagina. Vaginal discharge found.  Chaperone present for exam. No rashes, lesions, or tenderness to external genitalia. No erythema, injury, or tenderness to vaginal mucosa. Scant white vaginal discharge without bleeding within vaginal vault. No adnexal masses, tenderness, or fullness. +CMT and mild scant white discharge from the cervical os, no cervical friability. Cervical os is closed. Uterus non-deviated, mobile, nonTTP, and without enlargement.    Musculoskeletal: Normal range of motion.  Neurological: She is alert and oriented to person, place, and time. She has normal strength. No sensory deficit.  Skin: Skin is warm, dry and intact. No rash noted.  Psychiatric: She has a normal mood and affect.  Nursing note and vitals reviewed.   ED Course  Procedures (including critical care time) Labs Review Labs Reviewed  WET PREP, GENITAL - Abnormal; Notable for the following:    Clue Cells Wet Prep HPF POC PRESENT (*)    WBC, Wet Prep HPF POC MODERATE (*)    All other components within normal limits  COMPREHENSIVE METABOLIC PANEL - Abnormal; Notable for the following:    ALT 12 (*)    All other components within normal limits  URINALYSIS, ROUTINE W REFLEX MICROSCOPIC (NOT AT [facility]) - Abnormal; Notable for the following:  Hgb urine dipstick TRACE (*)    All other components within normal limits  URINE MICROSCOPIC-ADD ON - Abnormal; Notable for the following:    Squamous Epithelial / LPF 0-5 (*)    Bacteria, UA FEW (*)    All other components within normal limits  LIPASE, BLOOD  CBC  PREGNANCY, URINE  GC/CHLAMYDIA PROBE AMP ([place]) NOT AT [facility]   02/15/14 HIV/RPR/GC/CT testing: negative  Imaging Review No results found. I have personally reviewed and evaluated these images  and lab results as part of my medical decision-making.   EKG Interpretation None      MDM   Final diagnoses:  Abdominal pain, suprapubic, unspecified laterality  Dysuria  Cervicitis  PID (acute pelvic inflammatory disease)  BV (bacterial vaginosis)    35 y.o. female here with suprapubic abdominal pain radiating into her back, malodorous urine, dysuria, and increased urinary frequency x1wk. she's had multiple prior episodes of cystitis. On exam, suprapubic tenderness with mild left flank tenderness. No vaginal complaints, discussed that if her urine appears to be the source could bypass the pelvic exam is, but her urine is indeterminate or does not clearly show signs of infection then we should potentially proceed with a pelvic exam. Patient agrees with this plan. So far only Upreg is back which is negative; all other labs pending including the U/A. Will give Toradol for pain control. Doubt need for imaging at this time. Will reassess shortly.   9:24 AM CBC WNL. U/A resulting showing trace hgb, 0-5 RBCs, 0-5 squamous, no WBCs, and few bacteria. Nitrite and leuk neg. Doesn't seem that this would indicate UTI, so will proceed with pelvic exam.   9:33 AM Pelvic exam reveals scant white discharge in vaginal vault coming from cervix, +CMT, which could indicate PID. No adnexal tenderness. Doubt TOA or torsion, doubt need for pelvic u/s. Will await wet prep and remaining labs, but likely will treat for PID. Given Azithro and Rocephin here, and will likely go home with doxycycline. Will reassess shortly  9:44 AM Lipase WNL, CMP WNL. Wet prep showing clue cells, will treat for BV as well. Rx for naprosyn, flagyl, and doxycycline given. Abstain from sex until results for STD tests result, and for at least 10 days. [facility] for ongoing management of her recurrent BV/pelvic pain[facility]. She asked that I write for a rx  for yeast infection since she typically gets yeast infections when she takes abx. Will write for diflucan to be taken after abx finished. I explained the diagnosis and have given explicit precautions to return to the ER including for any other new or worsening symptoms. The patient understands and accepts the medical plan as it's been dictated and I have answered their questions. Discharge instructions concerning home care and prescriptions have been given. The patient is STABLE and is discharged to home in good condition.  BP 132/96 mmHg  Pulse 74  Temp(Src) 98.9 F (37.2 C) (Oral)  Resp 18  Ht 5\' 5"  (1.651 m)  Wt 75.297 kg  BMI 27.62 kg/m2  SpO2 100%  LMP 02/27/2016 (Exact Date)  Meds ordered this encounter  Medications  . ketorolac (TORADOL) 30 MG/ML injection 30 mg    Sig:   . azithromycin (ZITHROMAX) tablet 1,000 mg    Sig:   . cefTRIAXone (ROCEPHIN) 1 g in dextrose 5 % 50 mL IVPB    Sig:   . doxycycline (VIBRAMYCIN) 100 MG capsule  Sig: Take 1 capsule (100 mg total) by mouth 2 (two) times daily. One po bid x 14 days    Dispense:  28 capsule    Refill:  0    Order Specific Question:  Supervising Provider    Answer:  MILLER, BRIAN [[id number]]  . naproxen (NAPROSYN) 500 MG tablet    Sig: Take 1 tablet (500 mg total) by mouth 2 (two) times daily as needed for mild pain or moderate pain (TAKE WITH MEALS.).    Dispense:  20 tablet    Refill:  0    Order Specific Question:  Supervising Provider    Answer:  MILLER, BRIAN [[id number]]  . metroNIDAZOLE (FLAGYL) 500 MG tablet    Sig: Take 1 tablet (500 mg total) by mouth 2 (two) times daily. One po bid x 7 days    Dispense:  14 tablet    Refill:  0    Order Specific Question:  Supervising Provider    Answer:  Hyacinth Meeker, BRIAN [[id number]]  . fluconazole (DIFLUCAN) 150 MG tablet    Sig: Take 1 tablet (150 mg total) by mouth once. TO BE TAKEN AFTER YOU FINISH YOUR OTHER ANTIBIOTICS    Dispense:  1 tablet    Refill:  0    Order Specific Question:   Supervising Provider    Answer:  Eber Hong [[id number]]     Denise Knudtson Camprubi-Soms, PA-C 03/01/16 1610  Zadie Rhine, MD 03/01/16 1055

## 2016-03-01 NOTE — ED Notes (Addendum)
Patient reports lower mid abdominal pain x 2 days.  Reports pain increased the morning.  Denies diarrhea, nausea, vomiting.  Patient reports dysuria and left flank pain.

## 2016-03-02 LAB — GC/CHLAMYDIA PROBE AMP (~~LOC~~) NOT AT ARMC
Chlamydia: NEGATIVE
Neisseria Gonorrhea: NEGATIVE

## 2016-09-03 ENCOUNTER — Emergency Department (HOSPITAL_BASED_OUTPATIENT_CLINIC_OR_DEPARTMENT_OTHER)
Admission: EM | Admit: 2016-09-03 | Discharge: 2016-09-03 | Disposition: A | Discharge status: Home / Self Care | Payer: Self-pay | Attending: Emergency Medicine | Admitting: Emergency Medicine

## 2016-09-03 ENCOUNTER — Encounter (HOSPITAL_BASED_OUTPATIENT_CLINIC_OR_DEPARTMENT_OTHER): Payer: Self-pay | Admitting: *Deleted

## 2016-09-03 DIAGNOSIS — K0889 Other specified disorders of teeth and supporting structures: Secondary | ICD-10-CM | POA: Insufficient documentation

## 2016-09-03 DIAGNOSIS — F17210 Nicotine dependence, cigarettes, uncomplicated: Secondary | ICD-10-CM | POA: Insufficient documentation

## 2016-09-03 MED ORDER — KETOROLAC TROMETHAMINE 60 MG/2ML IM SOLN
60.0000 mg | Freq: Once | INTRAMUSCULAR | Status: AC
  Administered 2016-09-03: 60 mg via INTRAMUSCULAR
  Filled 2016-09-03: qty 2

## 2016-09-03 MED ORDER — IBUPROFEN 800 MG PO TABS: 800.0000 mg | ORAL_TABLET | Freq: Three times a day (TID) | ORAL | 0 refills | Status: DC

## 2016-09-03 MED ORDER — CEFTRIAXONE SODIUM 250 MG IJ SOLR
250.0000 mg | Freq: Once | INTRAMUSCULAR | Status: AC
  Administered 2016-09-03: 250 mg via INTRAMUSCULAR
  Filled 2016-09-03: qty 250

## 2016-09-03 MED ORDER — LIDOCAINE HCL (PF) 1 % IJ SOLN
INTRAMUSCULAR | Status: AC
  Administered 2016-09-03: 1 mL
  Filled 2016-09-03: qty 5

## 2016-09-03 NOTE — ED Provider Notes (Signed)
[facility]   CSN: [id number] Arrival date & time: 09/03/16  1235     History   Chief Complaint Chief Complaint  Patient presents with  . Dental Pain    HPI Eduardo Osiersha Bayliss is a 35 y.o. female.  HPI Patient has a dental pain for approximately a week. She reports that multiple teeth hurt. This is worst on the right lower and the left upper. He reports is causing her whole jaw to ache. No fevers, chills, neck stiffness or facial swelling. [facility]. She reports she has taken 2 days of amoxicillin but not had improvement. History reviewed. No pertinent past medical history.  There are no active problems to display for this patient.   Past Surgical History:  Procedure Laterality Date  . cyst removed from breast      OB History    No data available       Home Medications    Prior to Admission medications   Medication Sig Start Date End Date Taking? Authorizing Provider  AMOXICILLIN PO Take by mouth.   Yes Historical Provider, MD  doxycycline (VIBRAMYCIN) 100 MG capsule Take 1 capsule (100 mg total) by mouth 2 (two) times daily. One po bid x 14 days 03/01/16   Mercedes Camprubi-Soms, PA-C  fluconazole (DIFLUCAN) 150 MG tablet Take 1 tablet (150 mg total) by mouth once. TO BE TAKEN AFTER YOU FINISH YOUR OTHER ANTIBIOTICS 03/01/16   Mercedes Camprubi-Soms, PA-C  ibuprofen (ADVIL,MOTRIN) 800 MG tablet Take 1 tablet (800 mg total) by mouth every 8 (eight) hours as needed (for pain). 06/29/15   John Molpus, MD  ibuprofen (ADVIL,MOTRIN) 800 MG tablet Take 1 tablet (800 mg total) by mouth 3 (three) times daily. 09/03/16   Arby BarretteMarcy Andranik Jeune, MD  metroNIDAZOLE (FLAGYL) 500 MG tablet Take 1 tablet (500 mg total) by mouth 2 (two) times daily. One po bid x 7 days 03/01/16   Mercedes Camprubi-Soms, PA-C  naproxen (NAPROSYN) 500 MG tablet Take 1 tablet (500 mg total) by mouth 2 (two) times daily as needed for mild pain or  moderate pain (TAKE WITH MEALS.). 03/01/16   Mercedes Camprubi-Soms, PA-C  nitrofurantoin, macrocrystal-monohydrate, (MACROBID) 100 MG capsule Take 1 capsule (100 mg total) by mouth 2 (two) times daily. X 7 days 06/29/15   Paula LibraJohn Molpus, MD    Family History No family history on file.  Social History Social History  Substance Use Topics  . Smoking status: Current Every Day Smoker    Packs/day: 0.50    Types: Cigarettes  . Smokeless tobacco: Never Used  . Alcohol use No     Allergies   Iodine and Shellfish allergy   Review of Systems Review of Systems Constitutional: No fever no chills respiratory: No cough shortness of breath or mucus production.  Physical Exam Updated Vital Signs BP 131/89   Pulse 92   Temp 98.1 F (36.7 C) (Oral)   Resp 16   Ht 5\' 5"  (1.651 m)   Wt 155 lb (70.3 kg)   LMP 08/11/2016   SpO2 100%   BMI 25.79 kg/m   Physical Exam  Constitutional: She is oriented to person, place, and time. She appears well-developed and well-nourished.  Patient is alert and nontoxic. She is holding a towel roll around her neck and her hands on her jaws.  HENT:  Nose: Nose normal.  No facial swelling or erythema. Patient endorses tenderness to percussion over the first and second molar top left. She  has already had extractions of posterior upper molars. No drainage or discharge around the teeth. There is also tender first molar lower right. Some gum erythema but no drainage or discharge. Posterior or first widely patent.  Eyes: Conjunctivae and EOM are normal. Pupils are equal, round, and reactive to light.  Neck: Neck supple. No thyromegaly present.  Pulmonary/Chest: Effort normal.  Lymphadenopathy:    She has no cervical adenopathy.  Neurological: She is alert and oriented to person, place, and time. No cranial nerve deficit. She exhibits normal muscle tone. Coordination normal.  Skin: Skin is warm and dry.     ED Treatments / Results  Labs (all labs ordered are  listed, but only abnormal results are displayed) Labs Reviewed - No data to display  EKG  EKG Interpretation None       Radiology No results found.  Procedures Procedures (including critical care time)  Medications Ordered in ED Medications  cefTRIAXone (ROCEPHIN) injection 250 mg (250 mg Intramuscular Given 09/03/16 1402)  ketorolac (TORADOL) injection 60 mg (60 mg Intramuscular Given 09/03/16 1403)  lidocaine (PF) (XYLOCAINE) 1 % injection (1 mL  Given 09/03/16 1402)     Initial Impression / Assessment and Plan / ED Course  I have reviewed the triage vital signs and the nursing notes.  Pertinent labs & imaging results that were available during my care of the patient were reviewed by me and considered in my medical decision making (see chart for details).  Clinical Course     Final Clinical Impressions(s) / ED Diagnoses   Final diagnoses:  Pain, dental   Patient has started 2 days of treatment but continues to have moderately severe dental pain. At this time, she will be given 1 IM dose of Rocephin and Toradol. Instructed to continue her amoxicillin as prescribed 2 days ago. Starting tomorrow. She has Vicodin from prior prescription. She is counseled on using ibuprofen 3 times a day for additional pain control. New Prescriptions New Prescriptions   IBUPROFEN (ADVIL,MOTRIN) 800 MG TABLET    Take 1 tablet (800 mg total) by mouth 3 (three) times daily.     Arby BarretteMarcy Tehani Mersman, MD 09/03/16 [phone]

## 2016-09-03 NOTE — Discharge Instructions (Signed)
Complete your prescribed amoxicillin. Resume taking it tomorrow afternoon.

## 2016-09-03 NOTE — ED Triage Notes (Addendum)
Toothache x 1 week. She was started on [facility] 2 days ago [facility]

## 2016-10-14 ENCOUNTER — Encounter (HOSPITAL_BASED_OUTPATIENT_CLINIC_OR_DEPARTMENT_OTHER): Payer: Self-pay | Admitting: Emergency Medicine

## 2016-10-14 ENCOUNTER — Emergency Department (HOSPITAL_BASED_OUTPATIENT_CLINIC_OR_DEPARTMENT_OTHER)
Admission: EM | Admit: 2016-10-14 | Discharge: 2016-10-14 | Disposition: A | Discharge status: Home / Self Care | Payer: Self-pay | Attending: Emergency Medicine | Admitting: Emergency Medicine

## 2016-10-14 DIAGNOSIS — F17210 Nicotine dependence, cigarettes, uncomplicated: Secondary | ICD-10-CM | POA: Insufficient documentation

## 2016-10-14 DIAGNOSIS — K029 Dental caries, unspecified: Secondary | ICD-10-CM | POA: Insufficient documentation

## 2016-10-14 DIAGNOSIS — K0889 Other specified disorders of teeth and supporting structures: Secondary | ICD-10-CM

## 2016-10-14 MED ORDER — KETOROLAC TROMETHAMINE 30 MG/ML IJ SOLN
60.0000 mg | Freq: Once | INTRAMUSCULAR | Status: AC
  Administered 2016-10-14: 60 mg via INTRAMUSCULAR
  Filled 2016-10-14: qty 2

## 2016-10-14 MED ORDER — PENICILLIN V POTASSIUM 500 MG PO TABS: 500.0000 mg | ORAL_TABLET | Freq: Four times a day (QID) | ORAL | 0 refills | Status: DC

## 2016-10-14 MED ORDER — TRAMADOL HCL 50 MG PO TABS: 50.0000 mg | ORAL_TABLET | Freq: Four times a day (QID) | ORAL | 0 refills | Status: DC | PRN

## 2016-10-14 MED ORDER — HYDROCODONE-ACETAMINOPHEN 5-325 MG PO TABS
1.0000 | ORAL_TABLET | Freq: Once | ORAL | Status: AC
  Administered 2016-10-14: 1 via ORAL
  Filled 2016-10-14: qty 1

## 2016-10-14 MED ORDER — IBUPROFEN 800 MG PO TABS: 800.0000 mg | ORAL_TABLET | Freq: Three times a day (TID) | ORAL | 0 refills | Status: DC | PRN

## 2016-10-14 NOTE — Discharge Instructions (Signed)
Return here as needed.  Follow-up with the oral surgeon provided or a dentist of your choice

## 2016-10-14 NOTE — ED Provider Notes (Signed)
[facility]   CSN: [id number] Arrival date & time: 10/14/16  1021     History   Chief Complaint Chief Complaint  Patient presents with  . Dental Pain    HPI Denise Tran is a 36 y.o. female.  HPI Patient presents to the emergency department with dental pain in the right upper and lower dentition.  The patient has several decayed teeth.  She states that she is unsure which one is bothering her the most.  The patient states she did not take any medications prior to arrival.  She states that nothing seems make the condition better or worse.  Patient has not had any fever, nausea, vomiting, chest pain, shortness of breath, throat swelling, difficulty swallowing, or syncope History reviewed. No pertinent past medical history.  There are no active problems to display for this patient.   Past Surgical History:  Procedure Laterality Date  . cyst removed from breast      OB History    No data available       Home Medications    Prior to Admission medications   Medication Sig Start Date End Date Taking? Authorizing Provider  AMOXICILLIN PO Take by mouth.    Historical Provider, MD  doxycycline (VIBRAMYCIN) 100 MG capsule Take 1 capsule (100 mg total) by mouth 2 (two) times daily. One po bid x 14 days 03/01/16   Mercedes Street, PA-C  fluconazole (DIFLUCAN) 150 MG tablet Take 1 tablet (150 mg total) by mouth once. TO BE TAKEN AFTER YOU FINISH YOUR OTHER ANTIBIOTICS 03/01/16   Mercedes Street, PA-C  ibuprofen (ADVIL,MOTRIN) 800 MG tablet Take 1 tablet (800 mg total) by mouth every 8 (eight) hours as needed (for pain). 06/29/15   John Molpus, MD  ibuprofen (ADVIL,MOTRIN) 800 MG tablet Take 1 tablet (800 mg total) by mouth 3 (three) times daily. 09/03/16   Arby Barrette, MD  metroNIDAZOLE (FLAGYL) 500 MG tablet Take 1 tablet (500 mg total) by mouth 2 (two) times daily. One po bid x 7 days 03/01/16   [place], PA-C  naproxen (NAPROSYN) 500 MG tablet Take  1 tablet (500 mg total) by mouth 2 (two) times daily as needed for mild pain or moderate pain (TAKE WITH MEALS.). 03/01/16   Mercedes Street, PA-C  nitrofurantoin, macrocrystal-monohydrate, (MACROBID) 100 MG capsule Take 1 capsule (100 mg total) by mouth 2 (two) times daily. X 7 days 06/29/15   Paula Libra, MD    Family History No family history on file.  Social History Social History  Substance Use Topics  . Smoking status: Current Every Day Smoker    Packs/day: 0.50    Types: Cigarettes  . Smokeless tobacco: Never Used  . Alcohol use No     Allergies   Iodine and Shellfish allergy   Review of Systems Review of Systems  All other systems negative except as documented in the HPI. All pertinent positives and negatives as reviewed in the HPI. Physical Exam Updated Vital Signs BP (!) 150/110 (BP Location: Right Arm)   Pulse 108   Temp 98.2 F (36.8 C) (Oral)   Resp 24   Ht 5\' 5"  (1.651 m)   Wt 68 kg   LMP 10/12/2016   SpO2 99%   BMI 24.96 kg/m   Physical Exam  Constitutional: She is oriented to person, place, and time. She appears well-developed and well-nourished. No distress.  HENT:  Mouth/Throat: Oropharynx is clear and moist and mucous membranes are normal. No oral lesions. No  trismus in the jaw. Abnormal dentition. Dental caries present. No dental abscesses or uvula swelling. No oropharyngeal exudate or tonsillar abscesses.  Eyes: Pupils are equal, round, and reactive to light.  Neck: Normal range of motion. Neck supple.  Pulmonary/Chest: Effort normal.  Neurological: She is alert and oriented to person, place, and time.  Skin: Skin is warm and dry.  Nursing note and vitals reviewed.    ED Treatments / Results  Labs (all labs ordered are listed, but only abnormal results are displayed) Labs Reviewed - No data to display  EKG  EKG Interpretation None       Radiology No results found.  Procedures Procedures (including critical care  time)  Medications Ordered in ED Medications  HYDROcodone-acetaminophen (NORCO/VICODIN) 5-325 MG per tablet 1 tablet (not administered)  ketorolac (TORADOL) 30 MG/ML injection 60 mg (not administered)     Initial Impression / Assessment and Plan / ED Course  I have reviewed the triage vital signs and the nursing notes.  Pertinent labs & imaging results that were available during my care of the patient were reviewed by me and considered in my medical decision making (see chart for details).    Patient will be treated for dental decay and pain.  I have advised her to follow-up with a dentist.  Also, given her references to oral surgery as some of these teeth are broken off and may need to see an oral surgeon   Final Clinical Impressions(s) / ED Diagnoses   Final diagnoses:  None    New Prescriptions New Prescriptions   No medications on file     Charlestine NightChristopher Raylene Carmickle, PA-C 10/14/16 1054    Geoffery Lyonsouglas Delo, MD 10/14/16 1528

## 2016-10-14 NOTE — ED Notes (Signed)
Went in to discharge pt and found her to be gone. Called pt and advised she left without her discharge paperwork and prescriptions. Pt states she will come back and get them.

## 2016-10-14 NOTE — ED Triage Notes (Signed)
L side upper and lower dental pain x 1 month. Pt reports broken teeth.

## 2016-12-05 ENCOUNTER — Encounter (HOSPITAL_BASED_OUTPATIENT_CLINIC_OR_DEPARTMENT_OTHER): Payer: Self-pay | Admitting: *Deleted

## 2016-12-05 ENCOUNTER — Emergency Department (HOSPITAL_BASED_OUTPATIENT_CLINIC_OR_DEPARTMENT_OTHER)
Admission: EM | Admit: 2016-12-05 | Discharge: 2016-12-05 | Disposition: A | Discharge status: Home / Self Care | Payer: Self-pay | Attending: Emergency Medicine | Admitting: Emergency Medicine

## 2016-12-05 DIAGNOSIS — K047 Periapical abscess without sinus: Secondary | ICD-10-CM | POA: Insufficient documentation

## 2016-12-05 DIAGNOSIS — F17210 Nicotine dependence, cigarettes, uncomplicated: Secondary | ICD-10-CM | POA: Insufficient documentation

## 2016-12-05 MED ORDER — IBUPROFEN 600 MG PO TABS: 600.0000 mg | ORAL_TABLET | Freq: Four times a day (QID) | ORAL | 0 refills | Status: DC | PRN

## 2016-12-05 MED ORDER — HYDROCODONE-ACETAMINOPHEN 5-325 MG PO TABS: 1.0000 | ORAL_TABLET | Freq: Four times a day (QID) | ORAL | 0 refills | Status: DC | PRN

## 2016-12-05 MED ORDER — CLINDAMYCIN HCL 300 MG PO CAPS: 300.0000 mg | ORAL_CAPSULE | Freq: Three times a day (TID) | ORAL | 0 refills | Status: DC

## 2016-12-05 MED FILL — CLINDAMYCIN HCL 300 MG CAPS: 300 | 10 days supply | Qty: 30 | Fill #0

## 2016-12-05 MED FILL — HYDROCODON-APAP 5-325: 5-325 | 2 days supply | Qty: 6 | Fill #0

## 2016-12-05 MED FILL — IBUPROFEN 600 MG TABLET: 600 | 7 days supply | Qty: 30 | Fill #0

## 2016-12-05 NOTE — Discharge Instructions (Signed)
Follow up with Dr. Mayford Knifeurner Take antibiotic three times a day with food Take Ibuprofen three times a day with food Take Norco as needed for pain

## 2016-12-05 NOTE — ED Provider Notes (Signed)
[facility] Provider Note   CSN: [id number] Arrival date & time: 12/05/16  1310     History   Chief Complaint Chief Complaint  Patient presents with  . Dental Pain    HPI Denise Tran is a 36 y.o. female who presents with right lower dental pain. PMH significant for prior dental abscess. She states that she has had dental pain for months however she has had facial swelling and increased pain over the past several days. No fever, drooling, inability to swallow. She has an appointment with a dentist but it is not until May. She states her pain feels similar to when she had an abscess in the past which had to be drained and her tooth was pulled.  HPI  History reviewed. No pertinent past medical history.  There are no active problems to display for this patient.   Past Surgical History:  Procedure Laterality Date  . cyst removed from breast      OB History    No data available       Home Medications    Prior to Admission medications   Medication Sig Start Date End Date Taking? Authorizing Provider  AMOXICILLIN PO Take by mouth.    Historical Provider, MD  clindamycin (CLEOCIN) 300 MG capsule Take 1 capsule (300 mg total) by mouth 3 (three) times daily. 12/05/16   Bethel Born, PA-C  doxycycline (VIBRAMYCIN) 100 MG capsule Take 1 capsule (100 mg total) by mouth 2 (two) times daily. One po bid x 14 days 03/01/16   Mercedes Street, PA-C  fluconazole (DIFLUCAN) 150 MG tablet Take 1 tablet (150 mg total) by mouth once. TO BE TAKEN AFTER YOU FINISH YOUR OTHER ANTIBIOTICS 03/01/16   Mercedes Street, PA-C  HYDROcodone-acetaminophen (NORCO/VICODIN) 5-325 MG tablet Take 1 tablet by mouth every 6 (six) hours as needed for severe pain. 12/05/16   Bethel Born, PA-C  ibuprofen (ADVIL,MOTRIN) 600 MG tablet Take 1 tablet (600 mg total) by mouth every 6 (six) hours as needed. 12/05/16   Bethel Born, PA-C  metroNIDAZOLE (FLAGYL) 500 MG tablet Take 1 tablet (500 mg  total) by mouth 2 (two) times daily. One po bid x 7 days 03/01/16   [place], PA-C  naproxen (NAPROSYN) 500 MG tablet Take 1 tablet (500 mg total) by mouth 2 (two) times daily as needed for mild pain or moderate pain (TAKE WITH MEALS.). 03/01/16   Mercedes Street, PA-C  nitrofurantoin, macrocrystal-monohydrate, (MACROBID) 100 MG capsule Take 1 capsule (100 mg total) by mouth 2 (two) times daily. X 7 days 06/29/15   Paula Libra, MD  penicillin v potassium (VEETID) 500 MG tablet Take 1 tablet (500 mg total) by mouth 4 (four) times daily. 10/14/16   Charlestine Night, PA-C  traMADol (ULTRAM) 50 MG tablet Take 1 tablet (50 mg total) by mouth every 6 (six) hours as needed for severe pain. 10/14/16   Charlestine Night, PA-C    Family History No family history on file.  Social History Social History  Substance Use Topics  . Smoking status: Current Every Day Smoker    Packs/day: 0.50    Types: Cigarettes  . Smokeless tobacco: Never Used  . Alcohol use No     Allergies   Iodine and Shellfish allergy   Review of Systems Review of Systems  Constitutional: Negative for chills and fever.  HENT: Positive for dental problem and facial swelling. Negative for trouble swallowing.      Physical Exam Updated Vital Signs BP Marland Kitchen)  143/93   Pulse 82   Temp 98.6 F (37 C) (Oral)   Resp 18   LMP 11/14/2016   SpO2 100%   Physical Exam  Constitutional: She is oriented to person, place, and time. She appears well-developed and well-nourished. No distress.  HENT:  Head: Normocephalic and atraumatic.  Mouth/Throat: There is trismus (mild) in the jaw. Dental abscesses and dental caries (right lower molar caries) present.  Eyes: Conjunctivae are normal. Pupils are equal, round, and reactive to light. Right eye exhibits no discharge. Left eye exhibits no discharge. No scleral icterus.  Neck: Normal range of motion.  Cardiovascular: Normal rate.   Pulmonary/Chest: Effort normal. No respiratory  distress.  Abdominal: She exhibits no distension.  Neurological: She is alert and oriented to person, place, and time.  Skin: Skin is warm and dry.  Psychiatric: She has a normal mood and affect. Her behavior is normal.  Nursing note and vitals reviewed.    ED Treatments / Results  Labs (all labs ordered are listed, but only abnormal results are displayed) Labs Reviewed - No data to display  EKG  EKG Interpretation None       Radiology No results found.  Procedures Procedures (including critical care time)  Medications Ordered in ED Medications - No data to display   Initial Impression / Assessment and Plan / ED Course  I have reviewed the triage vital signs and the nursing notes.  Pertinent labs & imaging results that were available during my care of the patient were reviewed by me and considered in my medical decision making (see chart for details).  36 year old female with dental pain and facial swelling consistent with possible dental abscess. No drainable abscess seen. She has mild trismus but is handling secretions. No submandibular swelling. Will d/c with antibiotics and pain medicine and have her follow up with Dr. Mayford Knifeurner who is on call today. Return precautions given.  Final Clinical Impressions(s) / ED Diagnoses   Final diagnoses:  Dental abscess    New Prescriptions Discharge Medication List as of 12/05/2016  3:14 PM    START taking these medications   Details  clindamycin (CLEOCIN) 300 MG capsule Take 1 capsule (300 mg total) by mouth 3 (three) times daily., Starting Tue 12/05/2016, Print    HYDROcodone-acetaminophen (NORCO/VICODIN) 5-325 MG tablet Take 1 tablet by mouth every 6 (six) hours as needed for severe pain., Starting Tue 12/05/2016, Print         Bethel BornKelly Marie Kamya Watling, PA-C 12/05/16 1613    Loren Raceravid Yelverton, MD 12/07/16 [phone]

## 2016-12-05 NOTE — ED Triage Notes (Signed)
States she has a toothache and may have a dental abscess.

## 2017-01-30 ENCOUNTER — Emergency Department (HOSPITAL_BASED_OUTPATIENT_CLINIC_OR_DEPARTMENT_OTHER)
Admission: EM | Admit: 2017-01-30 | Discharge: 2017-01-30 | Disposition: A | Discharge status: Home / Self Care | Payer: Self-pay | Attending: Emergency Medicine | Admitting: Emergency Medicine

## 2017-01-30 ENCOUNTER — Encounter (HOSPITAL_BASED_OUTPATIENT_CLINIC_OR_DEPARTMENT_OTHER): Payer: Self-pay | Admitting: Emergency Medicine

## 2017-01-30 DIAGNOSIS — B9689 Other specified bacterial agents as the cause of diseases classified elsewhere: Secondary | ICD-10-CM

## 2017-01-30 DIAGNOSIS — N760 Acute vaginitis: Secondary | ICD-10-CM | POA: Insufficient documentation

## 2017-01-30 DIAGNOSIS — N730 Acute parametritis and pelvic cellulitis: Secondary | ICD-10-CM

## 2017-01-30 DIAGNOSIS — F17210 Nicotine dependence, cigarettes, uncomplicated: Secondary | ICD-10-CM | POA: Insufficient documentation

## 2017-01-30 LAB — WET PREP, GENITAL
SPERM: NONE SEEN
TRICH WET PREP: NONE SEEN
Yeast Wet Prep HPF POC: NONE SEEN

## 2017-01-30 LAB — URINALYSIS, ROUTINE W REFLEX MICROSCOPIC
Bilirubin Urine: NEGATIVE
GLUCOSE, UA: NEGATIVE mg/dL
HGB URINE DIPSTICK: NEGATIVE
Ketones, ur: NEGATIVE mg/dL
LEUKOCYTES UA: NEGATIVE
Nitrite: NEGATIVE
PH: 7.5 (ref 5.0–8.0)
Protein, ur: NEGATIVE mg/dL
SPECIFIC GRAVITY, URINE: 1.008 (ref 1.005–1.030)

## 2017-01-30 LAB — PREGNANCY, URINE: Preg Test, Ur: NEGATIVE

## 2017-01-30 MED ORDER — CEFTRIAXONE SODIUM 250 MG IJ SOLR
250.0000 mg | Freq: Once | INTRAMUSCULAR | Status: AC
  Administered 2017-01-30: 250 mg via INTRAMUSCULAR
  Filled 2017-01-30: qty 250

## 2017-01-30 MED ORDER — METRONIDAZOLE 0.75 % VA GEL: 1.0000 | Freq: Two times a day (BID) | VAGINAL | 0 refills | Status: AC

## 2017-01-30 MED ORDER — LIDOCAINE HCL (PF) 1 % IJ SOLN
INTRAMUSCULAR | Status: AC
  Administered 2017-01-30: 1 mL
  Filled 2017-01-30: qty 5

## 2017-01-30 MED ORDER — DOXYCYCLINE HYCLATE 100 MG PO CAPS: 100.0000 mg | ORAL_CAPSULE | Freq: Two times a day (BID) | ORAL | 0 refills | Status: AC

## 2017-01-30 NOTE — ED Provider Notes (Signed)
[facility]   CSN: [id number] Arrival date & time: 01/30/17  1351  By signing my name below, I, Denise Tran, attest that this documentation has been prepared under the direction and in the presence of Denise Russo, PA-C Electronically Signed: Deland Tran, ED Scribe. 01/30/17. 4:38 PM.  History   Chief Complaint Chief Complaint  Patient presents with  . Abdominal Pain  . Vaginal Discharge    The history is provided by the patient. No language interpreter was used.   HPI Comments: Denise Tran is a 36 y.o. female who presents to the Emergency Department complaining of constant, gradually worsening lower abdominal pain with associated "foul-smelling" discharge that began one week ago. She describes her pain as "cramping." She has associated nausea and increased frequency. Additionally, the pt reports that soon after treatment her symptoms return. She also reports that her current symptoms feel similar to her PMHx of PID. She states that the last time she had intercourse was last month with a female partner, and that it was protected. Her LNMP was 01/07/2017. Her last BM was today and was normal. The pt denies chest pain, SOB, fever, vaginal bleeding, and dyspareunia. She denies taking any medications for her pain. She denies alleviating factors. The pt additionally denies being prescribed any daily medications, and that she has had any surgeries. There are no other complaints at this time.  History reviewed. No pertinent past medical history.  There are no active problems to display for this patient.   Past Surgical History:  Procedure Laterality Date  . cyst removed from breast      OB History    No data available       Home Medications    Prior to Admission medications   Medication Sig Start Date End Date Taking? Authorizing Provider  AMOXICILLIN PO Take by mouth.    [provider]  clindamycin (CLEOCIN) 300 MG capsule Take 1  capsule (300 mg total) by mouth 3 (three) times daily. 12/05/16   Bethel Born, PA-C  doxycycline (VIBRAMYCIN) 100 MG capsule Take 1 capsule (100 mg total) by mouth 2 (two) times daily. 01/30/17 02/13/17  Tran, Denise N, PA-C  fluconazole (DIFLUCAN) 150 MG tablet Take 1 tablet (150 mg total) by mouth once. TO BE TAKEN AFTER YOU FINISH YOUR OTHER ANTIBIOTICS 03/01/16   Street, [place], [place]  HYDROcodone-acetaminophen (NORCO/VICODIN) 5-325 MG tablet Take 1 tablet by mouth every 6 (six) hours as needed for severe pain. 12/05/16   Bethel Born, PA-C  ibuprofen (ADVIL,MOTRIN) 600 MG tablet Take 1 tablet (600 mg total) by mouth every 6 (six) hours as needed. 12/05/16   Bethel Born, PA-C  metroNIDAZOLE (FLAGYL) 500 MG tablet Take 1 tablet (500 mg total) by mouth 2 (two) times daily. One po bid x 7 days 03/01/16   Street, [place], PA-C  metroNIDAZOLE (METROGEL) 0.75 % vaginal gel Place 1 Applicatorful vaginally 2 (two) times daily. 01/30/17 02/04/17  Tran, Denise N, PA-C  naproxen (NAPROSYN) 500 MG tablet Take 1 tablet (500 mg total) by mouth 2 (two) times daily as needed for mild pain or moderate pain (TAKE WITH MEALS.). 03/01/16   Street, [place], PA-C  nitrofurantoin, macrocrystal-monohydrate, (MACROBID) 100 MG capsule Take 1 capsule (100 mg total) by mouth 2 (two) times daily. X 7 days 06/29/15   Molpus, John, MD  penicillin v potassium (VEETID) 500 MG tablet Take 1 tablet (500 mg total) by mouth 4 (four) times daily. 10/14/16   Charlestine Night, PA-C  traMADol (ULTRAM) 50 MG tablet Take 1 tablet (50 mg total) by mouth every 6 (six) hours as needed for severe pain. 10/14/16   Charlestine NightLawyer, Christopher, PA-C    Family History No family history on file.  Social History Social History  Substance Use Topics  . Smoking status: Current Every Day Smoker    Packs/day: 0.50    Types: Cigarettes  . Smokeless tobacco: Never Used  . Alcohol use No     Allergies   Iodine and Shellfish  allergy   Review of Systems Review of Systems  Constitutional: Negative for fever.  Respiratory: Negative for shortness of breath.   Cardiovascular: Negative for chest pain.  Gastrointestinal: Positive for nausea.  Genitourinary: Positive for frequency, pelvic pain and vaginal discharge. Negative for dyspareunia and vaginal bleeding.     Physical Exam Updated Vital Signs BP 127/88 (BP Location: Right Arm)   Pulse 73   Temp 98.2 F (36.8 C) (Oral)   Resp 18   Ht 5\' 5"  (1.651 m)   Wt 65.8 kg (145 lb)   LMP 01/04/2017   SpO2 100%   BMI 24.13 kg/m   Physical Exam  Constitutional: She appears well-developed and well-nourished.  Pt is well appearing.  HENT:  Head: Normocephalic and atraumatic.  Eyes: Conjunctivae are normal.  Cardiovascular: Normal rate, regular rhythm and normal heart sounds.  Exam reveals no friction rub.   No murmur heard. Pulmonary/Chest: Effort normal and breath sounds normal. No respiratory distress. She has no wheezes. She has no rales.  Abdominal: Soft. Bowel sounds are normal. She exhibits no distension. There is no tenderness. There is no rebound and no guarding.  Genitourinary: Uterus normal. There is lesion (1 firm nontender white nodule in labial fold) on the right labia. There is lesion (firm, smooth cluster of non-tender flesh-colored nodules) on the left labia. Cervix exhibits motion tenderness and discharge (white discharge). Cervix exhibits no friability. Right adnexum displays no tenderness and no fullness. Left adnexum displays no tenderness and no fullness. No tenderness or bleeding in the vagina. Vaginal discharge (white discharge) found.  Genitourinary Comments: Exam performed with chaperone present.   Neurological: She is alert.  Skin: Skin is warm.  Psychiatric: She has a normal mood and affect. Her behavior is normal.  Nursing note and vitals reviewed.    ED Treatments / Results   DIAGNOSTIC STUDIES: Oxygen Saturation is 100% on  RA, normal by my interpretation.   COORDINATION OF CARE: 4:23 PM-Discussed next steps with pt. Pt verbalized understanding and is agreeable with the plan.   Labs (all labs ordered are listed, but only abnormal results are displayed) Labs Reviewed  WET PREP, GENITAL - Abnormal; Notable for the following:       Result Value   Clue Cells Wet Prep HPF POC PRESENT (*)    WBC, Wet Prep HPF POC MANY (*)    All other components within normal limits  URINALYSIS, ROUTINE W REFLEX MICROSCOPIC  PREGNANCY, URINE  HIV ANTIBODY (ROUTINE TESTING)  RPR  GC/CHLAMYDIA PROBE AMP ([place]) [facility]    EKG  EKG Interpretation None       Radiology No results found.  Procedures Procedures (including critical care time)  Medications Ordered in ED Medications  lidocaine (PF) (XYLOCAINE) 1 % injection (not administered)  cefTRIAXone (ROCEPHIN) injection 250 mg (250 mg Intramuscular Given 01/30/17 1828)     Initial Impression / Assessment and Plan / ED Course  I have reviewed the triage vital signs and the nursing  notes.  Pertinent labs & imaging results that were available during my care of the patient were reviewed by me and considered in my medical decision making (see chart for details).     Pt w vaginal discharge and CMT. Patient to be discharged with instructions to establish care and follow up with OBGYN. Discussed importance of using protection when sexually active. Pt understands that they have GC/Chlamydia cultures and HIV/RPR labs pending and that they will need to inform all sexual partners if results return positive.  Due to pt's history, pelvic exam, and wet prep with increased WBCs, pt has been treated w Rocephin in ED, and will be sent with Doxy for uncomplicated PID and flagyl topical gel for BV. Pt afebrile and nontoxic, well-appearing, hemodynamically stable, safe for discharge home.  Patient discussed with Dr. Silverio Lay. Discussed results, findings, treatment and follow  up. Patient advised of return precautions. Patient verbalized understanding and agreed with plan.    Final Clinical Impressions(s) / ED Diagnoses   Final diagnoses:  BV (bacterial vaginosis)  PID (acute pelvic inflammatory disease)    New Prescriptions New Prescriptions   DOXYCYCLINE (VIBRAMYCIN) 100 MG CAPSULE    Take 1 capsule (100 mg total) by mouth 2 (two) times daily.   METRONIDAZOLE (METROGEL) 0.75 % VAGINAL GEL    Place 1 Applicatorful vaginally 2 (two) times daily.   I personally performed the services described in this documentation, which was scribed in my presence. The recorded information has been reviewed and is accurate.     Tran, Denise N, PA-C 01/30/17 Silva Bandy    Charlynne Pander, MD 01/31/17 [phone]

## 2017-01-30 NOTE — Discharge Instructions (Signed)
Please read the instructions below.  Schedule an appointment for follow up and [facility]  Finish your antibiotics Flagyl/Metronidazole and Doxycycline as prescribed.  You will receive a call from the hospital if your test results come back positive. Avoid sexual activity until you know your test results. If your results come back positive, it is important that you inform all of your sexual partners.  Return to the ER for new or worsening symptoms.

## 2017-01-30 NOTE — ED Notes (Signed)
Pt verbalized understanding of discharge instructions and denies any further questions at this time.   

## 2017-01-30 NOTE — ED Triage Notes (Signed)
Lower abd pain with vaginal discharge x 1 week.

## 2017-01-31 LAB — GC/CHLAMYDIA PROBE AMP (~~LOC~~) NOT AT ARMC
CHLAMYDIA, DNA PROBE: NEGATIVE
NEISSERIA GONORRHEA: NEGATIVE

## 2017-01-31 LAB — RPR: RPR Ser Ql: NONREACTIVE

## 2017-01-31 LAB — HIV ANTIBODY (ROUTINE TESTING W REFLEX): HIV Screen 4th Generation wRfx: NONREACTIVE

## 2017-12-27 ENCOUNTER — Other Ambulatory Visit: Payer: Self-pay

## 2017-12-27 ENCOUNTER — Encounter (HOSPITAL_BASED_OUTPATIENT_CLINIC_OR_DEPARTMENT_OTHER): Payer: Self-pay | Admitting: *Deleted

## 2017-12-27 ENCOUNTER — Emergency Department (HOSPITAL_BASED_OUTPATIENT_CLINIC_OR_DEPARTMENT_OTHER)
Admission: EM | Admit: 2017-12-27 | Discharge: 2017-12-27 | Disposition: A | Discharge status: Home / Self Care | Payer: Self-pay | Attending: Emergency Medicine | Admitting: Emergency Medicine

## 2017-12-27 DIAGNOSIS — F17210 Nicotine dependence, cigarettes, uncomplicated: Secondary | ICD-10-CM | POA: Insufficient documentation

## 2017-12-27 DIAGNOSIS — N898 Other specified noninflammatory disorders of vagina: Secondary | ICD-10-CM | POA: Insufficient documentation

## 2017-12-27 DIAGNOSIS — R21 Rash and other nonspecific skin eruption: Secondary | ICD-10-CM | POA: Insufficient documentation

## 2017-12-27 LAB — URINALYSIS, MICROSCOPIC (REFLEX)

## 2017-12-27 LAB — URINALYSIS, ROUTINE W REFLEX MICROSCOPIC
BILIRUBIN URINE: NEGATIVE
GLUCOSE, UA: NEGATIVE mg/dL
Ketones, ur: NEGATIVE mg/dL
Leukocytes, UA: NEGATIVE
Nitrite: NEGATIVE
PH: 6 (ref 5.0–8.0)
Protein, ur: NEGATIVE mg/dL
SPECIFIC GRAVITY, URINE: 1.01 (ref 1.005–1.030)

## 2017-12-27 LAB — PREGNANCY, URINE: PREG TEST UR: NEGATIVE

## 2017-12-27 MED ORDER — PREDNISONE 50 MG PO TABS
60.0000 mg | ORAL_TABLET | Freq: Once | ORAL | Status: AC
  Administered 2017-12-27: 60 mg via ORAL
  Filled 2017-12-27: qty 1

## 2017-12-27 MED ORDER — TRIAMCINOLONE ACETONIDE 0.1 % EX CREA: 1.0000 "application " | TOPICAL_CREAM | Freq: Two times a day (BID) | CUTANEOUS | 0 refills | Status: DC

## 2017-12-27 MED ORDER — HYDROXYZINE HCL 25 MG PO TABS: 25.0000 mg | ORAL_TABLET | Freq: Four times a day (QID) | ORAL | 0 refills | Status: DC

## 2017-12-27 NOTE — Discharge Instructions (Addendum)
Triamcinolone cream for your rash. Atarax for your itch[facility]

## 2017-12-27 NOTE — ED Provider Notes (Signed)
[facility] EMERGENCY DEPARTMENT Provider Note   CSN: [id number] Arrival date & time: 12/27/17  2018     History   Chief Complaint Chief Complaint  Patient presents with  . Rash  . Vaginal Discharge    HPI Denise Tran is a 37 y.o. female.  Complaint is rash.  HPI 36-year female.  Got a tattoo a few weeks ago.  Developed a reaction.  Had to have medication.  Used a friend's triamcinolone cream.  Got another tattoo.  Has had another reaction.  Is here requesting a prescription for the same cream.  Does not plan on getting any more tattoos.  She also states "whenever I am here I like to get STD checked".  She denies symptoms or discharge or pain at this time.  History reviewed. No pertinent past medical history.  There are no active problems to display for this patient.   Past Surgical History:  Procedure Laterality Date  . cyst removed from breast       OB History   None      Home Medications    Prior to Admission medications   Medication Sig Start Date End Date Taking? Authorizing Provider  AMOXICILLIN PO Take by mouth.    [provider]  clindamycin (CLEOCIN) 300 MG capsule Take 1 capsule (300 mg total) by mouth 3 (three) times daily. 12/05/16   Bethel Born, PA-C  fluconazole (DIFLUCAN) 150 MG tablet Take 1 tablet (150 mg total) by mouth once. TO BE TAKEN AFTER YOU FINISH YOUR OTHER ANTIBIOTICS 03/01/16   Street, [place], [place]  HYDROcodone-acetaminophen (NORCO/VICODIN) 5-325 MG tablet Take 1 tablet by mouth every 6 (six) hours as needed for severe pain. 12/05/16   Bethel Born, PA-C  hydrOXYzine (ATARAX/VISTARIL) 25 MG tablet Take 1 tablet (25 mg total) by mouth every 6 (six) hours. 12/27/17   Rolland Porter, MD  ibuprofen (ADVIL,MOTRIN) 600 MG tablet Take 1 tablet (600 mg total) by mouth every 6 (six) hours as needed. 12/05/16   Bethel Born, PA-C  metroNIDAZOLE (FLAGYL) 500 MG tablet Take 1 tablet (500 mg total) by mouth 2 (two)  times daily. One po bid x 7 days 03/01/16   Street, [place], PA-C  naproxen (NAPROSYN) 500 MG tablet Take 1 tablet (500 mg total) by mouth 2 (two) times daily as needed for mild pain or moderate pain (TAKE WITH MEALS.). 03/01/16   Street, [place], PA-C  nitrofurantoin, macrocrystal-monohydrate, (MACROBID) 100 MG capsule Take 1 capsule (100 mg total) by mouth 2 (two) times daily. X 7 days 06/29/15   Molpus, John, MD  penicillin v potassium (VEETID) 500 MG tablet Take 1 tablet (500 mg total) by mouth 4 (four) times daily. 10/14/16   Lawyer, Cristal Deer, PA-C  traMADol (ULTRAM) 50 MG tablet Take 1 tablet (50 mg total) by mouth every 6 (six) hours as needed for severe pain. 10/14/16   Lawyer, Cristal Deer, PA-C  triamcinolone cream (KENALOG) 0.1 % Apply 1 application topically 2 (two) times daily. 12/27/17   Rolland Porter, MD    Family History No family history on file.  Social History Social History   Tobacco Use  . Smoking status: Current Every Day Smoker    Packs/day: 0.50    Types: Cigarettes  . Smokeless tobacco: Never Used  Substance Use Topics  . Alcohol use: No  . Drug use: No     Allergies   Iodine and Shellfish allergy   Review of Systems Review of Systems  Constitutional: Negative for appetite  change, chills, diaphoresis, fatigue and fever.  HENT: Negative for mouth sores, sore throat and trouble swallowing.   Eyes: Negative for visual disturbance.  Respiratory: Negative for cough, chest tightness, shortness of breath and wheezing.   Cardiovascular: Negative for chest pain.  Gastrointestinal: Negative for abdominal distention, abdominal pain, diarrhea, nausea and vomiting.  Endocrine: Negative for polydipsia, polyphagia and polyuria.  Genitourinary: Negative for dysuria, frequency and hematuria.  Musculoskeletal: Negative for gait problem.  Skin: Positive for rash. Negative for color change and pallor.  Neurological: Negative for dizziness, syncope, light-headedness and  headaches.  Hematological: Does not bruise/bleed easily.  Psychiatric/Behavioral: Negative for behavioral problems and confusion.     Physical Exam Updated Vital Signs BP (!) 136/101 (BP Location: Right Arm)   Pulse 76   Temp 98 F (36.7 C) (Oral)   Resp 18   Ht 5\' 5"  (1.651 m)   Wt 72.6 kg (160 lb)   LMP 12/10/2017   SpO2 100%   BMI 26.63 kg/m   Physical Exam  Constitutional: She is oriented to person, place, and time. She appears well-developed and well-nourished. No distress.  HENT:  Head: Normocephalic.  Eyes: Pupils are equal, round, and reactive to light. Conjunctivae are normal. No scleral icterus.  Neck: Normal range of motion. Neck supple. No thyromegaly present.  Cardiovascular: Normal rate and regular rhythm. Exam reveals no gallop and no friction rub.  No murmur heard. Pulmonary/Chest: Effort normal and breath sounds normal. No respiratory distress. She has no wheezes. She has no rales.  Abdominal: Soft. Bowel sounds are normal. She exhibits no distension. There is no tenderness. There is no rebound.  Musculoskeletal: Normal range of motion.  Neurological: She is alert and oriented to person, place, and time.  Skin: Skin is warm and dry. No rash noted.  T[place]oo sites with minimal surrounding erythema. Diffuse ue rash.  Psychiatric: She has a normal mood and affect. Her behavior is normal.     ED Treatments / Results  Labs (all labs ordered are listed, but only abnormal results are displayed) Labs Reviewed  URINALYSIS, ROUTINE W REFLEX MICROSCOPIC - Abnormal; Notable for the following components:      Result Value   Hgb urine dipstick SMALL (*)    All other components within normal limits  URINALYSIS, MICROSCOPIC (REFLEX) - Abnormal; Notable for the following components:   Bacteria, UA RARE (*)    Squamous Epithelial / LPF 0-5 (*)    All other components within normal limits  PREGNANCY, URINE    EKG None  Radiology No results  found.  Procedures Procedures (including critical care time)  Medications Ordered in ED Medications  predniSONE (DELTASONE) tablet 60 mg (has no administration in time range)     Initial Impression / Assessment and Plan / ED Course  I have reviewed the triage vital signs and the nursing notes.  Pertinent labs & imaging results that were available during my care of the patient were reviewed by me and considered in my medical decision making (see chart for details).     Sites do not appear infected. Will use steroid cream. [facility]d screening as pt not currently symptomatic.   Final Clinical Impressions(s) / ED Diagnoses   Final diagnoses:  Rash  Vaginal discharge    ED Discharge Orders        Ordered    hydrOXYzine (ATARAX/VISTARIL) 25 MG tablet  Every 6 hours     12/27/17 2253    triamcinolone cream (KENALOG) 0.1 %  2 times daily     12/27/17 2253       Rolland Porter, MD 12/27/17 2257

## 2017-12-27 NOTE — ED Triage Notes (Signed)
Rash over her body x 3 days. States she is allergic to tattoo dye. She has been getting relief by using her friends triamcinolone acetonide cream. She also wants to be checked for a STD. Vaginal discharge with odor.

## 2018-01-13 ENCOUNTER — Other Ambulatory Visit: Payer: Self-pay

## 2018-01-13 ENCOUNTER — Emergency Department (HOSPITAL_BASED_OUTPATIENT_CLINIC_OR_DEPARTMENT_OTHER)
Admission: EM | Admit: 2018-01-13 | Discharge: 2018-01-13 | Disposition: A | Discharge status: Home / Self Care | Payer: Self-pay | Attending: Emergency Medicine | Admitting: Emergency Medicine

## 2018-01-13 ENCOUNTER — Encounter (HOSPITAL_BASED_OUTPATIENT_CLINIC_OR_DEPARTMENT_OTHER): Payer: Self-pay | Admitting: Emergency Medicine

## 2018-01-13 DIAGNOSIS — R21 Rash and other nonspecific skin eruption: Secondary | ICD-10-CM | POA: Insufficient documentation

## 2018-01-13 DIAGNOSIS — Z5321 Procedure and treatment not carried out due to patient leaving prior to being seen by health care provider: Secondary | ICD-10-CM | POA: Insufficient documentation
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
## 2018-01-14 NOTE — ED Notes (Signed)
Pt called for VS recheck x 3, no answer

## 2020-09-23 ENCOUNTER — Emergency Department (HOSPITAL_BASED_OUTPATIENT_CLINIC_OR_DEPARTMENT_OTHER): Payer: Medicaid - Out of State

## 2020-09-23 ENCOUNTER — Encounter (HOSPITAL_BASED_OUTPATIENT_CLINIC_OR_DEPARTMENT_OTHER): Payer: Self-pay

## 2020-09-23 ENCOUNTER — Emergency Department (HOSPITAL_BASED_OUTPATIENT_CLINIC_OR_DEPARTMENT_OTHER)
Admission: EM | Admit: 2020-09-23 | Discharge: 2020-09-24 | Disposition: A | Discharge status: Home / Self Care | Payer: Medicaid - Out of State | Source: Home / Self Care | Attending: Emergency Medicine | Admitting: Emergency Medicine

## 2020-09-23 ENCOUNTER — Emergency Department (HOSPITAL_BASED_OUTPATIENT_CLINIC_OR_DEPARTMENT_OTHER)
Admission: EM | Admit: 2020-09-23 | Discharge: 2020-09-23 | Discharge status: Against Medical Advice | Payer: Medicaid - Out of State | Attending: Emergency Medicine | Admitting: Emergency Medicine

## 2020-09-23 ENCOUNTER — Emergency Department (HOSPITAL_COMMUNITY): Payer: Medicaid - Out of State

## 2020-09-23 ENCOUNTER — Other Ambulatory Visit: Payer: Self-pay

## 2020-09-23 DIAGNOSIS — Y92149 Unspecified place in prison as the place of occurrence of the external cause: Secondary | ICD-10-CM | POA: Insufficient documentation

## 2020-09-23 DIAGNOSIS — R52 Pain, unspecified: Secondary | ICD-10-CM

## 2020-09-23 DIAGNOSIS — M545 Low back pain, unspecified: Secondary | ICD-10-CM | POA: Diagnosis not present

## 2020-09-23 DIAGNOSIS — M79602 Pain in left arm: Secondary | ICD-10-CM | POA: Insufficient documentation

## 2020-09-23 DIAGNOSIS — S161XXA Strain of muscle, fascia and tendon at neck level, initial encounter: Secondary | ICD-10-CM

## 2020-09-23 DIAGNOSIS — M25572 Pain in left ankle and joints of left foot: Secondary | ICD-10-CM | POA: Diagnosis not present

## 2020-09-23 DIAGNOSIS — W1839XA Other fall on same level, initial encounter: Secondary | ICD-10-CM | POA: Diagnosis not present

## 2020-09-23 DIAGNOSIS — M79672 Pain in left foot: Secondary | ICD-10-CM | POA: Diagnosis not present

## 2020-09-23 DIAGNOSIS — T07XXXA Unspecified multiple injuries, initial encounter: Secondary | ICD-10-CM | POA: Insufficient documentation

## 2020-09-23 DIAGNOSIS — M25532 Pain in left wrist: Secondary | ICD-10-CM | POA: Diagnosis not present

## 2020-09-23 DIAGNOSIS — S39012A Strain of muscle, fascia and tendon of lower back, initial encounter: Secondary | ICD-10-CM

## 2020-09-23 DIAGNOSIS — W010XXA Fall on same level from slipping, tripping and stumbling without subsequent striking against object, initial encounter: Secondary | ICD-10-CM

## 2020-09-23 DIAGNOSIS — F17210 Nicotine dependence, cigarettes, uncomplicated: Secondary | ICD-10-CM | POA: Insufficient documentation

## 2020-09-23 LAB — PREGNANCY, URINE: Preg Test, Ur: NEGATIVE

## 2020-09-23 NOTE — ED Notes (Signed)
Pt given urine cup and advised of need for urine sample
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
## 2020-09-23 NOTE — ED Notes (Signed)
Pt is aware she needs a urine sample 
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
## 2020-09-23 NOTE — ED Notes (Signed)
Pt seen walking out of department, told staff she was coming back

## 2020-09-23 NOTE — ED Notes (Signed)
Pt requested water, ok per [employer]. Pt given the same
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
## 2020-09-23 NOTE — ED Notes (Signed)
Waited 20 minutes, patient never returned, removed off floor

## 2020-09-23 NOTE — ED Triage Notes (Addendum)
Pt seen/left earlier- states she returned with urine sample and needs further xrays-advised she needs to give urine sample while in ED-given CCUA kit
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
## 2020-09-24 ENCOUNTER — Emergency Department (HOSPITAL_BASED_OUTPATIENT_CLINIC_OR_DEPARTMENT_OTHER): Payer: Medicaid - Out of State

## 2020-09-24 MED ORDER — NAPROXEN 250 MG PO TABS
500.0000 mg | ORAL_TABLET | Freq: Once | ORAL | Status: AC
  Administered 2020-09-24: 500 mg via ORAL
  Filled 2020-09-24: qty 2

## 2020-09-24 MED ORDER — NAPROXEN 500 MG PO TABS: ORAL_TABLET | ORAL | 0 refills | Status: DC

## 2020-09-24 MED ORDER — HYDROCODONE-ACETAMINOPHEN 5-325 MG PO TABS: 1.0000 | ORAL_TABLET | Freq: Four times a day (QID) | ORAL | 0 refills | Status: DC | PRN
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
## 2022-08-29 IMAGING — DX DG ANKLE COMPLETE 3+V*L*
3 series · 3 of 3 positions shown · non-contrast
Comparison: None.

CLINICAL DATA: Pain

EXAM:
LEFT ANKLE COMPLETE - 3+ VIEW

[ankle ap]
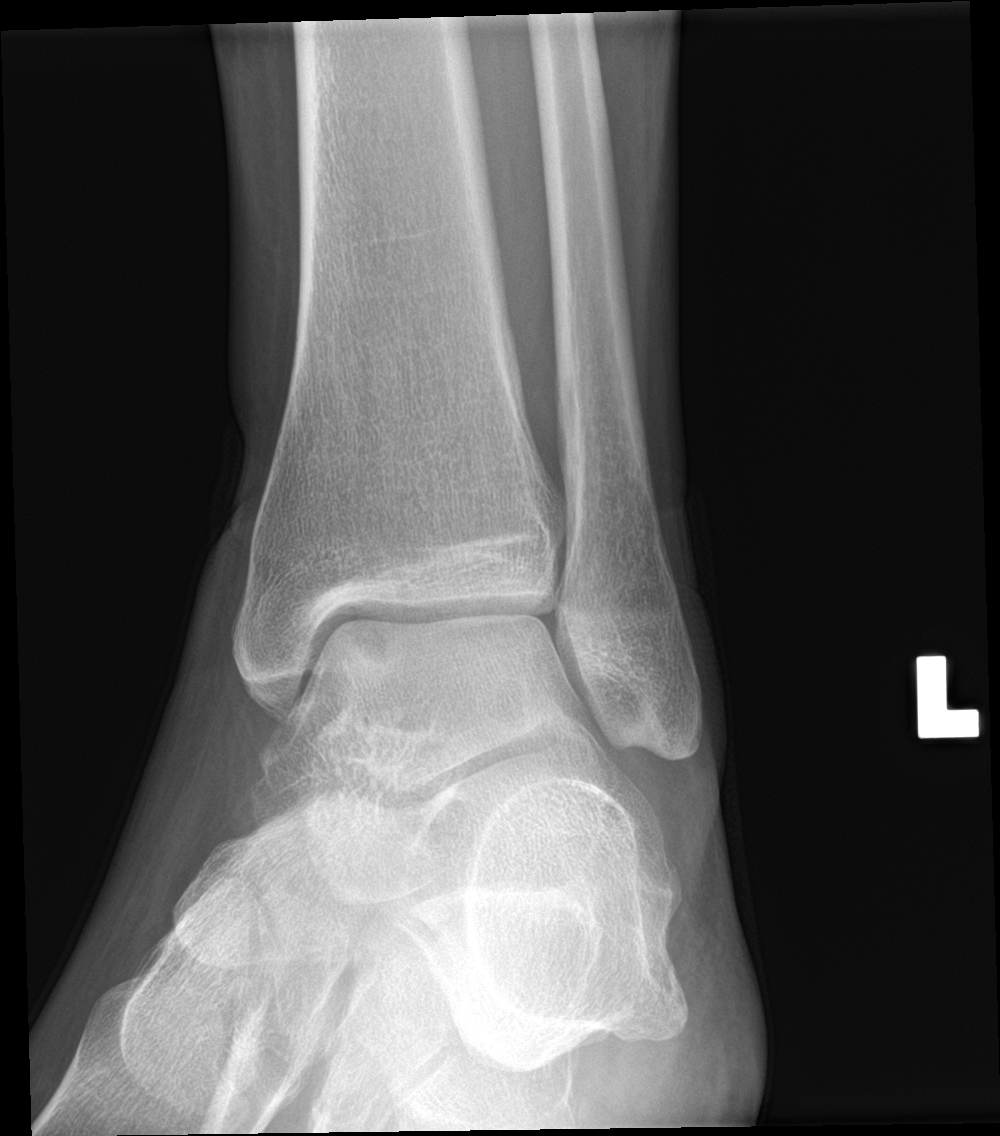

[ankle obl]
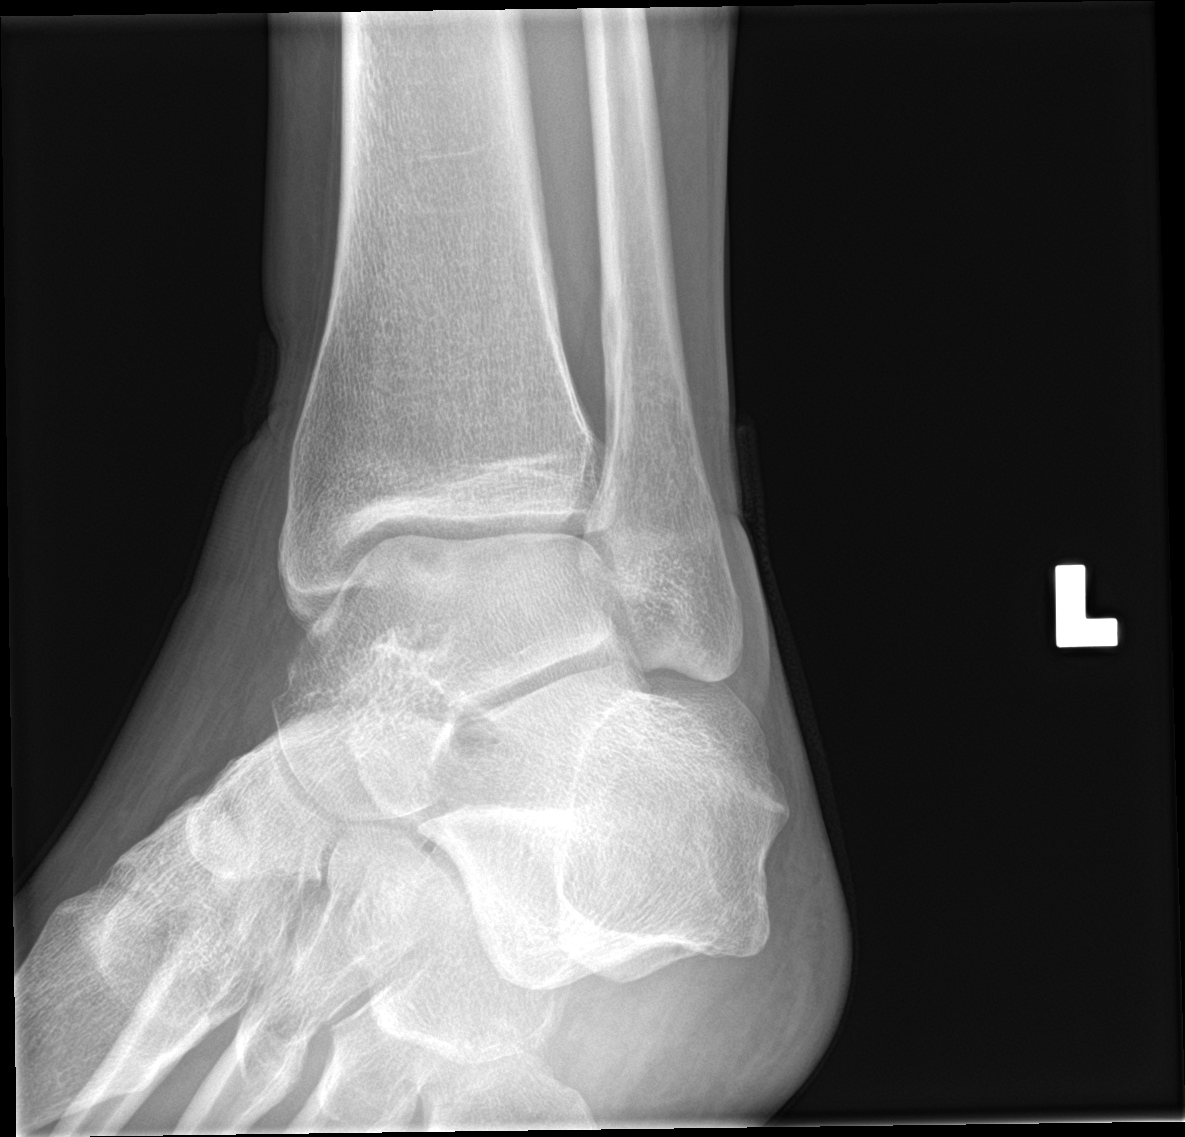

[ankle lat]
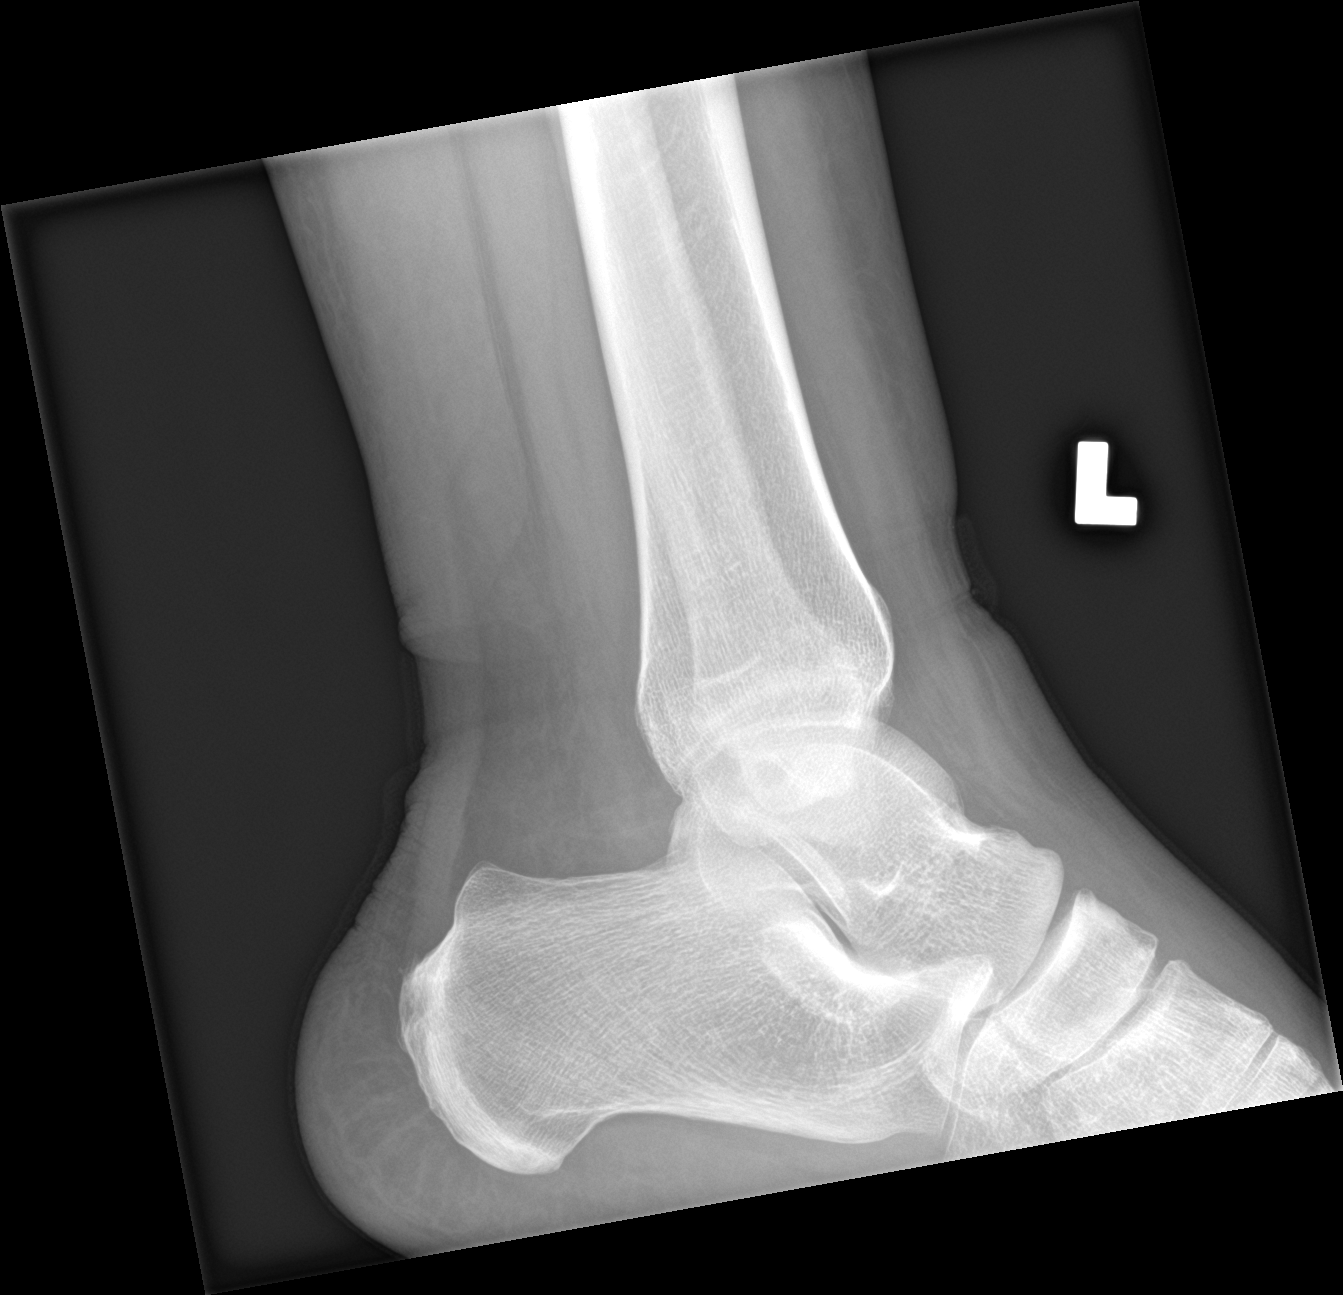

[3 of 3 positions shown; findings below may reference images not displayed]

FINDINGS: There is no acute displaced fracture or dislocation. There is a
lucency in the talar dome which may represent degenerative changes
versus an osteochondral defect. There is no significant surrounding
soft tissue swelling. No radiopaque foreign body.
IMPRESSION: 1. No acute displaced fracture or dislocation.
2. Lucency in the talar dome may represent degenerative changes
versus an osteochondral defect. No significant surrounding soft
tissue swelling.

## 2023-06-01 ENCOUNTER — Encounter: Payer: Medicaid Other | Admitting: Obstetrics and Gynecology

## 2023-06-11 ENCOUNTER — Encounter: Payer: Medicaid Other | Admitting: Obstetrics and Gynecology

## 2023-08-24 ENCOUNTER — Encounter: Payer: Medicaid Other | Admitting: Obstetrics and Gynecology

## 2023-09-25 ENCOUNTER — Encounter (HOSPITAL_BASED_OUTPATIENT_CLINIC_OR_DEPARTMENT_OTHER): Payer: Self-pay

## 2023-09-25 ENCOUNTER — Emergency Department (HOSPITAL_BASED_OUTPATIENT_CLINIC_OR_DEPARTMENT_OTHER)
Admission: EM | Admit: 2023-09-25 | Discharge: 2023-09-25 | Disposition: A | Discharge status: Home / Self Care | Payer: Medicaid Other | Attending: Emergency Medicine | Admitting: Emergency Medicine

## 2023-09-25 ENCOUNTER — Other Ambulatory Visit: Payer: Self-pay

## 2023-09-25 DIAGNOSIS — R21 Rash and other nonspecific skin eruption: Secondary | ICD-10-CM | POA: Diagnosis present

## 2023-09-25 MED ORDER — HYDROCORTISONE 1 % EX CREA: TOPICAL_CREAM | CUTANEOUS | 0 refills | Status: DC

## 2023-09-25 MED ORDER — CETIRIZINE HCL 10 MG PO TABS: 10.0000 mg | ORAL_TABLET | Freq: Every day | ORAL | 0 refills | Status: AC

## 2023-09-25 MED ORDER — EPINEPHRINE 0.3 MG/0.3ML IJ SOAJ: 0.3000 mg | INTRAMUSCULAR | 0 refills | Status: DC | PRN

## 2023-09-25 NOTE — ED Provider Notes (Signed)
 [place] [facility] Provider Note   CSN: [id number] Arrival date & time: 09/25/23  1651     History  Chief Complaint  Patient presents with   Rash    Denise Tran is a 43 y.o. female.   Rash  Pt reports to the ED today with a 1 day Hx of  pruritic rash after popping a zit on her face yesterday. Noticed it this morning, has not gotten worse. Has tried benadryl w/o relief and irritated skin cream with relief. Has allergies to shellfish and peanuts. Denies any ingestion of these. 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 Has not been prescribed an epipen .  Denies change in soaps.  Denies, cough, chest pain, sore throat, throat tightness, shortness of breath, wheezing, swelling.     Home Medications Prior to Admission medications   Medication Sig Start Date End Date Taking? Authorizing Provider  cetirizine  (ZYRTEC  ALLERGY) 10 MG tablet Take 1 tablet (10 mg total) by mouth daily. 09/25/23  Yes Larinda Herter S, PA-C  EPINEPHrine  0.3 mg/0.3 mL IJ SOAJ injection Inject 0.3 mg into the muscle as needed for anaphylaxis. 09/25/23  Yes Paulino Cork S, PA-C  HYDROcodone -acetaminophen  (NORCO) 5-325 MG tablet Take 1 tablet by mouth every 6 (six) hours as needed. 09/24/20   Molpus, John, MD  hydrocortisone  cream 1 % Apply to affected area 2 times daily 09/25/23  Yes Brinsley Wence S, PA-C  naproxen  (NAPROSYN ) 500 MG tablet Take 1 tablet twice daily as needed for pain. 09/24/20   Molpus, John, MD      Allergies    Iodine and Shellfish allergy    Review of Systems   Review of Systems  Skin:  Positive for rash.  All other systems reviewed and are negative.   Physical Exam Updated Vital Signs BP 115/72 (BP Location: Left Arm)   Pulse (!) 102   Temp 98.8 F (37.1 C)   Resp 16   Ht 5' 5 (1.651 m)   Wt 74.8 kg   LMP 09/16/2023 (Exact Date)   SpO2 100%   BMI 27.46 kg/m  Physical Exam Vitals and nursing note reviewed.   Constitutional:      Appearance: Normal appearance.  HENT:     Head: Normocephalic and atraumatic.  Eyes:     Extraocular Movements: Extraocular movements intact.     Conjunctiva/sclera: Conjunctivae normal.  Cardiovascular:     Rate and Rhythm: Normal rate and regular rhythm.     Pulses: Normal pulses.     Heart sounds: Normal heart sounds. No murmur heard.    No friction rub. No gallop.  Pulmonary:     Effort: Pulmonary effort is normal. No respiratory distress.     Breath sounds: Normal breath sounds.  Abdominal:     General: Abdomen is flat.     Palpations: Abdomen is soft.     Tenderness: There is no abdominal tenderness.  Skin:    General: Skin is warm and dry.     Findings: Rash (Uticaria noted on L side of face and extending to the L side of neck.) present.  Neurological:     General: No focal deficit present.     Mental Status: She is alert. Mental status is at baseline.  Psychiatric:        Mood and Affect: Mood normal.     ED Results / Procedures / Treatments   Labs (all labs ordered are listed, but only abnormal results are displayed) Labs Reviewed -  No data to display  EKG None  Radiology No results found.  Procedures Procedures    Medications Ordered in ED Medications - No data to display  ED Course/ Medical Decision Making/ A&P                                 Medical Decision Making  This patient is a 43 year old female who presents to the ED for concern of rash.   Differential diagnoses prior to evaluation: The emergent differential diagnosis includes, but is not limited to, viral exanthem, acne vulgaris, urticaria, anaphylaxis this is not an exhaustive differential.   Past Medical History / Co-morbidities / Social History: Anaphylaxis,  Additional history: Chart reviewed. Pertinent results include: Has been seen previously in 2019 for a rash that was treated with triamcinolone  cream.  Lab Tests/Imaging studies: I do not believe any  labs or imaging are needed at this time.    Medications: I ordered medication including Zyrtec , hydrocortisone  cream, EpiPen .  I have reviewed the patients home medicines and have made adjustments as needed.  ED Course:   Patient is a 43 year old female presents the ED today with a 1 day history of rash that happened after she popped a zit.  Says that she noticed the rash this morning and has not gotten any better but has been incredibly pruritic.  Has been relieved with itching cream that she is used today.  Denies recent soap changes, shellfish, peanut exposure.  Rash has not been getting worse or extending further.  On exam urticaria was noted on the right side of face and extending down to the neck.  Normal heart rate, no edema facial edema or throat swelling present.  I believe this is probably due to reaction due to patient's history of previous allergic reactions.  Do not believe that anaphylaxis is present at this time.  At home with steroid cream to help with urticaria and also will provide Zyrtec .  Provided strict return to ER precautions and provided EpiPen  to pharmacy due to patient not being previously prescribed with a past history of anaphylaxis.  Case was discussed with attending who agreed.  Patient vital signs remained stable throughout her time here.  [name].   Disposition: After consideration of the diagnostic results and the patients response to treatment, I feel that patient benefit from discharge and treatment as above.   emergency department workup does not suggest an emergent condition requiring admission or immediate intervention beyond what has been performed at this time. The plan is: Hydrocortisone  cream, Zyrtec , EpiPen  at home, return for new or worsening symptoms. The patient is safe for discharge and has been instructed to return immediately for worsening symptoms, change in symptoms or any other concerns.   Final  Clinical Impression(s) / ED Diagnoses Final diagnoses:  Rash    Rx / DC Orders ED Discharge Orders          Ordered    EPINEPHrine  0.3 mg/0.3 mL IJ SOAJ injection  As needed        09/25/23 1740    cetirizine  (ZYRTEC  ALLERGY) 10 MG tablet  Daily        09/25/23 1740    hydrocortisone  cream 1 %        09/25/23 1740              Naphtali Zywicki S, PA-C 09/25/23 1828    Dreama Longs,  MD 09/26/23 1321

## 2023-09-25 NOTE — Discharge Instructions (Signed)
 You were seen today for a rash on the right side of face and extending to neck.  I have provided you with a topical steroid cream which you can use to help with itching.  I am also prescribing you Zyrtec  which will help with any allergic reaction as well.  I am also prescribing an EpiPen  which you are to pick up from the store and carry with you due to your history of anaphylaxis.  You should have 1 with you at all times.  If you begin having worsening rash, throat swelling, shortness of breath, fever, skin becomes hot to the touch, or you get facial swelling, return to ER for immediate evaluation.

## 2023-09-25 NOTE — ED Triage Notes (Signed)
 States had a bump on her face that she "popped" and drainage came out. Woke up with rash to face, neck and bilateral shoulders. Itching. Denies difficulty breathing/swallowing. Took benadryl yesterday.

## 2023-12-21 ENCOUNTER — Emergency Department (HOSPITAL_BASED_OUTPATIENT_CLINIC_OR_DEPARTMENT_OTHER)
Admission: EM | Admit: 2023-12-21 | Discharge: 2023-12-21 | Disposition: A | Discharge status: Home / Self Care | Attending: Emergency Medicine | Admitting: Emergency Medicine

## 2023-12-21 ENCOUNTER — Other Ambulatory Visit: Payer: Self-pay

## 2023-12-21 ENCOUNTER — Encounter (HOSPITAL_BASED_OUTPATIENT_CLINIC_OR_DEPARTMENT_OTHER): Payer: Self-pay | Admitting: Emergency Medicine

## 2023-12-21 DIAGNOSIS — E876 Hypokalemia: Secondary | ICD-10-CM | POA: Insufficient documentation

## 2023-12-21 DIAGNOSIS — Z113 Encounter for screening for infections with a predominantly sexual mode of transmission: Secondary | ICD-10-CM | POA: Diagnosis not present

## 2023-12-21 DIAGNOSIS — N898 Other specified noninflammatory disorders of vagina: Secondary | ICD-10-CM | POA: Diagnosis present

## 2023-12-21 DIAGNOSIS — B379 Candidiasis, unspecified: Secondary | ICD-10-CM | POA: Insufficient documentation

## 2023-12-21 DIAGNOSIS — R110 Nausea: Secondary | ICD-10-CM | POA: Diagnosis not present

## 2023-12-21 LAB — URINALYSIS, MICROSCOPIC (REFLEX)

## 2023-12-21 LAB — COMPREHENSIVE METABOLIC PANEL WITH GFR
ALT: 15 U/L (ref 0–44)
AST: 25 U/L (ref 15–41)
Albumin: 4.8 g/dL (ref 3.5–5.0)
Alkaline Phosphatase: 60 U/L (ref 38–126)
Anion gap: 14 (ref 5–15)
BUN: 15 mg/dL (ref 6–20)
CO2: 25 mmol/L (ref 22–32)
Calcium: 9.8 mg/dL (ref 8.9–10.3)
Chloride: 102 mmol/L (ref 98–111)
Creatinine, Ser: 0.96 mg/dL (ref 0.44–1.00)
GFR, Estimated: >60 mL/min (ref >60)
Glucose, Bld: 112 mg/dL — ABNORMAL HIGH (ref 70–99)
Potassium: 3.2 mmol/L — ABNORMAL LOW (ref 3.5–5.1)
Sodium: 141 mmol/L (ref 135–145)
Total Bilirubin: 0.8 mg/dL (ref 0.0–1.2)
Total Protein: 8.9 g/dL — ABNORMAL HIGH (ref 6.5–8.1)

## 2023-12-21 LAB — URINALYSIS, ROUTINE W REFLEX MICROSCOPIC
Glucose, UA: NEGATIVE mg/dL
Ketones, ur: 15 mg/dL — AB
Nitrite: NEGATIVE
Protein, ur: >=300 mg/dL — AB
Specific Gravity, Urine: 1.025 (ref 1.005–1.030)
pH: 6 (ref 5.0–8.0)

## 2023-12-21 LAB — CBC
HCT: 39.2 % (ref 36.0–46.0)
Hemoglobin: 12.8 g/dL (ref 12.0–15.0)
MCH: 26.9 pg (ref 26.0–34.0)
MCHC: 32.7 g/dL (ref 30.0–36.0)
MCV: 82.5 fL (ref 80.0–100.0)
Platelets: 360 10*3/uL (ref 150–400)
RBC: 4.75 MIL/uL (ref 3.87–5.11)
RDW: 19 % — ABNORMAL HIGH (ref 11.5–15.5)
WBC: 9.9 10*3/uL (ref 4.0–10.5)
nRBC: 0 % (ref 0.0–0.2)

## 2023-12-21 LAB — WET PREP, GENITAL
Clue Cells Wet Prep HPF POC: NONE SEEN
Sperm: NONE SEEN
Trich, Wet Prep: NONE SEEN
WBC, Wet Prep HPF POC: <10 (ref <10)

## 2023-12-21 LAB — PREGNANCY, URINE: Preg Test, Ur: NEGATIVE

## 2023-12-21 LAB — LIPASE, BLOOD: Lipase: 47 U/L (ref 11–51)

## 2023-12-21 MED ORDER — FLUCONAZOLE 100 MG PO TABS: ORAL_TABLET | ORAL | 0 refills | Status: DC

## 2023-12-21 MED ORDER — ONDANSETRON 4 MG PO TBDP: 4.0000 mg | ORAL_TABLET | Freq: Three times a day (TID) | ORAL | 0 refills | Status: DC | PRN

## 2023-12-21 NOTE — ED Provider Notes (Signed)
 [place] [facility] Provider Note   CSN: [id number] Arrival date & time: 12/21/23  1339     History Chief Complaint  Patient presents with   Emesis    Denise Tran is a 43 y.o. female.  Patient presents to the emergency department today with concerns of nausea and STI concerns.  States that she had been having vomiting and some diarrhea since Wednesday of earlier this week.  She reports that today she had 1 episode of vomiting around 5 AM but has been tolerating liquid intake.  States that the nausea appears to be improving and she is primarily concerned at this time for possible STIs.  She reports having unprotected sex several days ago and now having vaginal itching and some discharge but denies any dysuria, hematuria, or increased urine frequency or urgency.   Emesis      Home Medications Prior to Admission medications   Medication Sig Start Date End Date Taking? Authorizing Provider  fluconazole (DIFLUCAN) 100 MG tablet Take 1.5 tablets on day 1. If symptoms still present after 72 hours, you may repeat the dose. 12/21/23  Yes Maryanna Shape A, PA-C  ondansetron (ZOFRAN-ODT) 4 MG disintegrating tablet Take 1 tablet (4 mg total) by mouth every 8 (eight) hours as needed for nausea or vomiting. 12/21/23  Yes Smitty Knudsen, PA-C  cetirizine (ZYRTEC ALLERGY) 10 MG tablet Take 1 tablet (10 mg total) by mouth daily. 09/25/23   Lunette Stands, PA-C  EPINEPHrine 0.3 mg/0.3 mL IJ SOAJ injection Inject 0.3 mg into the muscle as needed for anaphylaxis. 09/25/23   Lunette Stands, PA-C  HYDROcodone-acetaminophen (NORCO) 5-325 MG tablet Take 1 tablet by mouth every 6 (six) hours as needed. 09/24/20   Molpus, John, MD  hydrocortisone cream 1 % Apply to affected area 2 times daily 09/25/23   Lunette Stands, PA-C  naproxen (NAPROSYN) 500 MG tablet Take 1 tablet twice daily as needed for pain. 09/24/20   Molpus, Jonny Ruiz, MD      Allergies    Iodine and Shellfish  allergy    Review of Systems   Review of Systems  Gastrointestinal:  Positive for vomiting.  All other systems reviewed and are negative.   Physical Exam Updated Vital Signs BP (!) 135/95   Pulse 62   Temp 98.9 F (37.2 C) (Oral)   Resp 16   Ht 5\' 5"  (1.651 m)   Wt 74.8 kg   SpO2 100%   BMI 27.46 kg/m  Physical Exam Vitals and nursing note reviewed.  Constitutional:      General: She is not in acute distress.    Appearance: She is well-developed.  HENT:     Head: Normocephalic and atraumatic.  Eyes:     Conjunctiva/sclera: Conjunctivae normal.  Cardiovascular:     Rate and Rhythm: Normal rate and regular rhythm.     Heart sounds: No murmur heard. Pulmonary:     Effort: Pulmonary effort is normal. No respiratory distress.     Breath sounds: Normal breath sounds.  Abdominal:     General: Abdomen is flat. Bowel sounds are normal. There is no distension.     Palpations: Abdomen is soft.     Tenderness: There is no abdominal tenderness. There is no guarding.  Musculoskeletal:        General: No swelling.     Cervical back: Neck supple.  Skin:    General: Skin is warm and dry.     Capillary Refill:  Capillary refill takes less than 2 seconds.  Neurological:     Mental Status: She is alert.  Psychiatric:        Mood and Affect: Mood normal.     ED Results / Procedures / Treatments   Labs (all labs ordered are listed, but only abnormal results are displayed) Labs Reviewed  WET PREP, GENITAL - Abnormal; Notable for the following components:      Result Value   Yeast Wet Prep HPF POC PRESENT (*)    All other components within normal limits  COMPREHENSIVE METABOLIC PANEL WITH GFR - Abnormal; Notable for the following components:   Potassium 3.2 (*)    Glucose, Bld 112 (*)    Total Protein 8.9 (*)    All other components within normal limits  CBC - Abnormal; Notable for the following components:   RDW 19.0 (*)    All other components within normal limits   URINALYSIS, ROUTINE W REFLEX MICROSCOPIC - Abnormal; Notable for the following components:   APPearance HAZY (*)    Hgb urine dipstick LARGE (*)    Bilirubin Urine MODERATE (*)    Ketones, ur 15 (*)    Protein, ur >=300 (*)    Leukocytes,Ua TRACE (*)    All other components within normal limits  URINALYSIS, MICROSCOPIC (REFLEX) - Abnormal; Notable for the following components:   Bacteria, UA FEW (*)    All other components within normal limits  LIPASE, BLOOD  PREGNANCY, URINE  RPR  HIV ANTIBODY (ROUTINE TESTING W REFLEX)  GC/CHLAMYDIA PROBE AMP ([place]) NOT AT [facility]    EKG None  Radiology No results found.  Procedures Procedures    Medications Ordered in ED Medications - No data to display  ED Course/ Medical Decision Making/ A&P                                 Medical Decision Making Amount and/or Complexity of Data Reviewed Labs: ordered.   This patient presents to the ED for concern of nausea, STIs.  Differential diagnosis includes gastroenteritis, bowel obstruction, dehydration, gonorrhea, syphilis, UTI   Lab Tests:  I Ordered, and personally interpreted labs.  The pertinent results include: CBC unremarkable, CMP with mild hypokalemia likely secondary to GI loss, UA with some concern for possible infection but there is significant contamination, wet prep positive for yeast, GC/chlamydia pending, RPR pending, HIV pending   Problem List / ED Course:  Patient presents today with concerns of nausea and STIs.  She states that she had experiencing vomiting and some diarrhea for approximately 3 days.  States that she is now feeling somewhat better although still feels unsure about eating food but has tolerated liquid well today.  She also has some concerns for possible STIs due to vaginal irritation and discharge.  States that she has had vaginal itching since she had unprotected sex with a partner.  She is requesting STI testing.  Denies any urinary  symptoms. Physical exam is reassuring.  There is no focal abdominal tenderness.  Pelvic exam deferred. Will obtain basic labs for assessment of possible source of symptoms.  Added on STI testing. Lab workup is reassuring.  Possible UTI although patient asymptomatic.  There is significant contamination so unclear if this is a valid urine test to base decision on.  In the setting of no UTI symptoms, will hold off on empiric treatment.  Wet prep is concerning for yeast infection.  Gonorrhea, RPR, and HIV collected and pending. Given workup and exam, will discharge patient home with a prescription for Zofran for nausea as needed.  Will also send home with a prescription for fluconazole for treatment of a yeast infection.  Advised patient to return the emergency department for any concerns or new or worsening symptoms.  Advised patient as well that the STI results will typically take several days to return back and she will be contacted if these results are positive requiring treatment.  Patient verbalized understanding need for follow-up on these test.  Otherwise stable for outpatient follow-up and discharged home.  Final Clinical Impression(s) / ED Diagnoses Final diagnoses:  Nausea  Yeast infection  Screening examination for STI    Rx / DC Orders ED Discharge Orders          Ordered    fluconazole (DIFLUCAN) 100 MG tablet        12/21/23 1658    ondansetron (ZOFRAN-ODT) 4 MG disintegrating tablet  Every 8 hours PRN        12/21/23 1658              Smitty Knudsen, PA-C 12/21/23 1701    Vanetta Mulders, MD 12/21/23 2322

## 2023-12-21 NOTE — Discharge Instructions (Addendum)
 You are seen the emergency department today for concerns of nausea and STI concerns.  You were found to have a yeast infection.  A prescription for fluconazole has been sent to her pharmacy.  He should take the initial dose today with a repeat dose in 72 hours if symptoms are still present.  I have also sent a prescription for Zofran which is a nausea medication to help manage nausea if still present.  Your STI results will be available in the next several days.  If these are positive, you likely require treatment.

## 2023-12-21 NOTE — ED Notes (Signed)
 Pt. Was assessed by PA C and treated according to his assessment.  Pt. In no distress at time of discharge.

## 2023-12-21 NOTE — ED Triage Notes (Signed)
 Vomiting since wed  had some diarrhia  not eating well  abd pain  denies dysuria wants STD check has a d/c started  ittcchy started today

## 2023-12-22 LAB — RPR: RPR Ser Ql: NONREACTIVE

## 2023-12-22 LAB — HIV ANTIBODY (ROUTINE TESTING W REFLEX): HIV Screen 4th Generation wRfx: NONREACTIVE

## 2023-12-24 LAB — GC/CHLAMYDIA PROBE AMP (~~LOC~~) NOT AT ARMC
Chlamydia: NEGATIVE
Comment: NEGATIVE
Comment: NORMAL
Neisseria Gonorrhea: NEGATIVE

## 2023-12-25 ENCOUNTER — Telehealth: Payer: Self-pay

## 2023-12-25 NOTE — Telephone Encounter (Signed)
 Received inbound call from patient who reports she viewed her mychart and see the bacteria and wants to be treated for bacteria.   Per chart review patient was seen on 12/21/23 for emesis and patient was prescribed zofran and fluconazole for yeast infection. This RNCM notified EDP who reports patient's UA was contaminated. Patient reports she does not have any new abdominal pain and has one more dose of diflucan.  Patient is agreeable if symptoms worsen to return to ED.    No additional TOC needs.

## 2024-04-29 ENCOUNTER — Ambulatory Visit (INDEPENDENT_AMBULATORY_CARE_PROVIDER_SITE_OTHER): Admitting: Obstetrics and Gynecology

## 2024-04-29 ENCOUNTER — Other Ambulatory Visit (HOSPITAL_COMMUNITY)
Admission: RE | Admit: 2024-04-29 | Discharge: 2024-04-29 | Disposition: A | Discharge status: Home / Self Care | Source: Ambulatory Visit | Attending: Obstetrics and Gynecology | Admitting: Obstetrics and Gynecology

## 2024-04-29 VITALS — BP 132/96 | HR 75 | Ht 65.0 in | Wt 170.0 lb

## 2024-04-29 DIAGNOSIS — N898 Other specified noninflammatory disorders of vagina: Secondary | ICD-10-CM

## 2024-04-29 DIAGNOSIS — R3915 Urgency of urination: Secondary | ICD-10-CM

## 2024-04-29 LAB — POCT URINALYSIS DIPSTICK
Bilirubin, UA: NEGATIVE
Glucose, UA: NEGATIVE
Ketones, UA: NEGATIVE
Nitrite, UA: NEGATIVE
Protein, UA: POSITIVE — AB
Spec Grav, UA: 1.01 (ref 1.010–1.025)
Urobilinogen, UA: NEGATIVE U/dL — AB
pH, UA: 6 (ref 5.0–8.0)

## 2024-04-29 NOTE — Progress Notes (Signed)
   GYNECOLOGY PROGRESS NOTE  History:  43 y.o. No obstetric history on file. presents to [facility] for problem gyn visit. She reports recurrent vaginal odor for years.She reports the odor is more than a normal vaginal odor and it smells like a  retained tampon Reports being treated for bacterial vaginosis, seems it is working but will come back once meds are completed. She does not use scented soaps/detergents. She tried boric acid with no relief. She stopped having intercourse to see if that was the cause and did not notice a difference. She just started oral probiotics 2 months ago. She has never been tested for mycoplasma/ureaplasma  Health Maintenance Due  Topic Date Due   Hepatitis C Screening  Never done   DTaP/Tdap/Td (1 - Tdap) Never done   Pneumococcal Vaccine (1 of 2 - PCV) Never done   Hepatitis B Vaccines 19-59 Average Risk (1 of 3 - 19+ 3-dose series) Never done   HPV VACCINES (1 - 3-dose SCDM series) Never done   Cervical Cancer Screening (HPV/Pap Cotest)  Never done   COVID-19 Vaccine (1 - 2024-25 season) Never done   INFLUENZA VACCINE  04/11/2024     Review of Systems:  Pertinent items are noted in HPI.   Objective:  Physical Exam Blood pressure (!) 132/96, pulse 75, height 5' 5 (1.651 m), weight 170 lb (77.1 kg), last menstrual period 04/11/2024. VS reviewed, nursing note reviewed,  Constitutional: well developed, well nourished, no distress HEENT: normocephalic Pulm/chest wall: normal effort Abdomen: soft Neuro: alert and oriented  Skin: warm, dry Psych: affect normal Pelvic exam: Cervix pink, visually closed, without lesion, scant white creamy discharge, vaginal walls and external genitalia normal Bimanual exam: neg CMT, uterus nontender, nonenlarged, adnexa without tenderness, enlargement, or mass  Assessment & Plan:  1. Vaginal discharge (Primary) Discussed recurrent symptoms, continue probiotic. Discussed checking bv/yeast and mycoplasma/ureaplasma.  Discussed studies with treating partner if positive as well.  Discussed prolonged therapy x 6 months for recurrent bv  Consider vaginal estrogen if continues despite treatment  - Cervicovaginal ancillary only - Mycoplasma / ureaplasma culture  2. Urinary urgency  - Urine Culture - POCT Urinalysis Dipstick  Plan follow up in one month   Nidia Daring, FNP

## 2024-04-30 LAB — CERVICOVAGINAL ANCILLARY ONLY
Bacterial Vaginitis (gardnerella): POSITIVE — AB
Candida Glabrata: NEGATIVE
Candida Vaginitis: NEGATIVE
Chlamydia: NEGATIVE
Comment: NEGATIVE
Comment: NEGATIVE
Comment: NEGATIVE
Comment: NEGATIVE
Comment: NEGATIVE
Comment: NORMAL
Neisseria Gonorrhea: NEGATIVE
Trichomonas: NEGATIVE

## 2024-05-01 ENCOUNTER — Other Ambulatory Visit: Payer: Self-pay

## 2024-05-01 DIAGNOSIS — N760 Acute vaginitis: Secondary | ICD-10-CM

## 2024-05-01 LAB — URINE CULTURE

## 2024-05-01 MED ORDER — METRONIDAZOLE 500 MG PO TABS: 500.0000 mg | ORAL_TABLET | Freq: Two times a day (BID) | ORAL | 0 refills | Status: DC

## 2024-05-01 NOTE — Progress Notes (Signed)
 Patient tested positive for BV. Metronidazole  take 1 tablet BID x 7 days was sent to her pharmacy. A Mychart message was sent to the patient.  Tuwana Kapaun l Sheri Gatchel, CMA

## 2024-05-05 ENCOUNTER — Other Ambulatory Visit: Payer: Self-pay | Admitting: Obstetrics and Gynecology

## 2024-05-05 ENCOUNTER — Ambulatory Visit: Payer: Self-pay | Admitting: Obstetrics and Gynecology

## 2024-05-05 ENCOUNTER — Other Ambulatory Visit: Payer: Self-pay

## 2024-05-05 DIAGNOSIS — A493 Mycoplasma infection, unspecified site: Secondary | ICD-10-CM

## 2024-05-05 MED ORDER — FLUCONAZOLE 150 MG PO TABS
150.0000 mg | ORAL_TABLET | Freq: Once | ORAL | 1 refills | Status: AC
Start: 2024-05-05 — End: 2024-05-05

## 2024-05-05 MED ORDER — DOXYCYCLINE HYCLATE 100 MG PO CAPS: 100.0000 mg | ORAL_CAPSULE | Freq: Two times a day (BID) | ORAL | 0 refills | Status: DC

## 2024-05-05 MED ORDER — DOXYCYCLINE HYCLATE 100 MG PO CAPS: 100.0000 mg | ORAL_CAPSULE | Freq: Two times a day (BID) | ORAL | 0 refills | Status: AC

## 2024-05-05 MED ORDER — METRONIDAZOLE 500 MG PO TABS: 500.0000 mg | ORAL_TABLET | Freq: Two times a day (BID) | ORAL | 0 refills | Status: AC

## 2024-05-06 LAB — MYCOPLASMA / UREAPLASMA CULTURE
Mycoplasma hominis Culture: POSITIVE — AB
Ureaplasma urealyticum: NEGATIVE

## 2024-05-27 ENCOUNTER — Ambulatory Visit

## 2024-05-27 VITALS — BP 138/100 | HR 92 | Ht 65.0 in | Wt 166.0 lb

## 2024-05-27 DIAGNOSIS — A493 Mycoplasma infection, unspecified site: Secondary | ICD-10-CM

## 2024-05-27 DIAGNOSIS — B3731 Acute candidiasis of vulva and vagina: Secondary | ICD-10-CM | POA: Diagnosis not present

## 2024-05-27 MED ORDER — FLUCONAZOLE 150 MG PO TABS: 150.0000 mg | ORAL_TABLET | Freq: Every day | ORAL | 0 refills | Status: DC | PRN

## 2024-05-27 MED ORDER — AZITHROMYCIN 500 MG PO TABS: ORAL_TABLET | ORAL | 0 refills | Status: DC

## 2024-05-27 NOTE — Progress Notes (Signed)
   GYNECOLOGY PROBLEM OFFICE VISIT NOTE  History:  Denise Tran is a 43 y.o. female here today for follow up visit. She was recently diagnosed and treated for Mycoplasma infection that was thought to be contributing to her recurrent bacterial vaginosis infections.  Patient reports completion of medication 3 days ago.  She denies vaginal odor, but reports thick cottage cheese discharge.  Patient states she is concerned that the BV will return and questions if any stronger or additional medications can be prescribed.  Patient denies any abnormal vaginal  bleeding, pelvic pain or itching/irritation.   She denies issues with urination, but reports frequency.   No past medical history on file.  Past Surgical History:  Procedure Laterality Date   cyst removed from breast      The following portions of the patient's history were reviewed and updated as appropriate: allergies, current medications, past family history, past medical history, past social history, past surgical history and problem list.   Health Maintenance:  Patient's last menstrual period was 04/11/2024 (exact date). Normal pap Reports last Year.  No mammogram on file.   Review of Systems:  Breasts:  No complaints. Genito-Urinary ROS: positive for - vulvar/vaginal symptoms and frequency Gastrointestinal ROS: negative Objective:  Vitals: BP (!) 138/100   Pulse 92   Ht 5' 5 (1.651 m)   Wt 166 lb (75.3 kg)   LMP 05/03/2024   BMI 27.62 kg/m   Physical Exam: Physical Exam Constitutional:      Appearance: Normal appearance.  HENT:     Head: Normocephalic and atraumatic.  Eyes:     Conjunctiva/sclera: Conjunctivae normal.  Cardiovascular:     Rate and Rhythm: Normal rate and regular rhythm.     Heart sounds: Normal heart sounds.  Pulmonary:     Effort: Pulmonary effort is normal. No respiratory distress.     Breath sounds: Normal breath sounds.  Abdominal:     Palpations: Abdomen is soft.  Musculoskeletal:         General: Normal range of motion.     Cervical back: Normal range of motion.  Neurological:     Mental Status: She is alert and oriented to person, place, and time.  Skin:    General: Skin is warm and dry.  Psychiatric:        Mood and Affect: Mood normal.        Behavior: Behavior normal.  Vitals reviewed.      Labs and Imaging: No results found.  Assessment & Plan:  43 year old Mycoplasma Infection Vaginal yeast   -Discussed recent treatment and CDC guidelines.  Informed that she received 21 day treatment, when CDC recommends 7 day f/b Zithromax  x 3 days.  Patient desires to proceed with additional antibiotics.  -Reviewed vaginal symptoms and informed of yeast infection. Discussed treating, today, with Diflucan , and again after completion of Zithromax . Patient agreeable.  -Reviewed chart and no pap on file.  Patient thinks she had one last year, but is unsure if she has had abnormal findings in past.  -Discussed returning in 4 months for annual exam with pap and mammogram.    Total face-to-face time with patient: 15 minutes   Synthia Raisin, CNM 05/27/2024 1:08 PM

## 2024-06-09 ENCOUNTER — Emergency Department (HOSPITAL_BASED_OUTPATIENT_CLINIC_OR_DEPARTMENT_OTHER)
Admission: EM | Admit: 2024-06-09 | Discharge: 2024-06-09 | Disposition: A | Discharge status: Home / Self Care | Attending: Emergency Medicine | Admitting: Emergency Medicine

## 2024-06-09 ENCOUNTER — Encounter (HOSPITAL_BASED_OUTPATIENT_CLINIC_OR_DEPARTMENT_OTHER): Payer: Self-pay

## 2024-06-09 DIAGNOSIS — R21 Rash and other nonspecific skin eruption: Secondary | ICD-10-CM | POA: Diagnosis present

## 2024-06-09 MED ORDER — PREDNISONE 10 MG PO TABS
60.0000 mg | ORAL_TABLET | Freq: Once | ORAL | Status: AC
  Administered 2024-06-09: 60 mg via ORAL
  Filled 2024-06-09: qty 1

## 2024-06-09 MED ORDER — PREDNISONE 10 MG (21) PO TBPK: ORAL_TABLET | ORAL | 0 refills | Status: AC

## 2024-06-09 NOTE — ED Provider Notes (Signed)
 [facility]  Provider Note  CSN: [id number] Arrival date & time: 06/09/24 2043  History Chief Complaint  Patient presents with   Rash    Denise Tran is a 43 y.o. female here for itchy  rash for the last few days, on arms, face and neck. Similar to previous allergic reactions, unclear source in the past. She recently started taking a chlorophyll supplement but otherwise no new soaps, lotions, detergents, etc. Tried OTC antihistamines and topical steroids without improvement. She is concerned for shingles.    Home Medications Prior to Admission medications   Medication Sig Start Date End Date Taking? Authorizing Provider  predniSONE  (STERAPRED UNI-PAK 21 TAB) 10 MG (21) TBPK tablet 10mg  Tabs, 6 day taper. Use as directed 06/09/24  Yes Roselyn Carlin NOVAK, MD  cetirizine  (ZYRTEC  ALLERGY) 10 MG tablet Take 1 tablet (10 mg total) by mouth daily. 09/25/23   Bauer, Collin S, PA-C  EPINEPHrine  0.3 mg/0.3 mL IJ SOAJ injection Inject 0.3 mg into the muscle as needed for anaphylaxis. Patient not taking: Reported on 04/29/2024 09/25/23   Bauer, Collin S, PA-C  hydrocortisone  cream 1 % Apply to affected area 2 times daily Patient not taking: Reported on 04/29/2024 09/25/23   Bauer, Collin S, PA-C     Allergies    Iodine and Shellfish allergy   Review of Systems   Review of Systems Please see HPI for pertinent positives and negatives  Physical Exam BP (!) 135/99 (BP Location: Right Arm)   Pulse 90   Temp 97.8 F (36.6 C) (Temporal)   Resp 18   Ht 5' 5 (1.651 m)   Wt 74.8 kg   LMP 06/01/2024 (Exact Date)   SpO2 99%   BMI 27.46 kg/m   Physical Exam Vitals and nursing note reviewed.  Constitutional:      Appearance: Normal appearance.  HENT:     Head: Normocephalic and atraumatic.     Nose: Nose normal.     Mouth/Throat:     Mouth: Mucous membranes are moist.  Eyes:     Extraocular Movements: Extraocular movements intact.      Conjunctiva/sclera: Conjunctivae normal.  Cardiovascular:     Rate and Rhythm: Normal rate.  Pulmonary:     Effort: Pulmonary effort is normal.     Breath sounds: Normal breath sounds.  Abdominal:     General: Abdomen is flat.     Palpations: Abdomen is soft.     Tenderness: There is no abdominal tenderness.  Musculoskeletal:        General: No swelling. Normal range of motion.     Cervical back: Neck supple.  Skin:    General: Skin is warm and dry.     Comments: Occasional small pruritic papules on arms, neck and face; no zoster rash  Neurological:     General: No focal deficit present.     Mental Status: She is alert.  Psychiatric:        Mood and Affect: Mood normal.     ED Results / Procedures / Treatments   EKG None  Procedures Procedures  Medications Ordered in the ED Medications  predniSONE  (DELTASONE ) tablet 60 mg (has no administration in time range)    Initial Impression and Plan  Patient here for itchy rash, possibly allergic in etiology. Definitely not zoster, denies insect bites. Will give course of prednisone , continue OTC antihistamines, referred to allergy if symptoms persist.   ED Course  MDM Rules/Calculators/A&P Medical Decision Making Problems Addressed: Rash: acute illness or injury  Risk Prescription drug management.     Final Clinical Impression(s) / ED Diagnoses Final diagnoses:  Rash    Rx / DC Orders ED Discharge Orders          Ordered    predniSONE  (STERAPRED UNI-PAK 21 TAB) 10 MG (21) TBPK tablet        06/09/24 2319             Roselyn Carlin NOVAK, MD 06/09/24 [phone]

## 2024-06-09 NOTE — ED Triage Notes (Signed)
 Pt states she noticed the rash on Saturday.   Raised bumps on arms and back of neck +itching No new detergents, soaps etc Has started taking liquid Chlorophyll 2 weeks ago only new supplement Has been using benadryl, zyrtec  and triamcinolone  cream to areas with no relief

## 2024-08-29 ENCOUNTER — Emergency Department (HOSPITAL_BASED_OUTPATIENT_CLINIC_OR_DEPARTMENT_OTHER)
Admission: EM | Admit: 2024-08-29 | Discharge: 2024-08-29 | Disposition: A | Discharge status: Home / Self Care | Attending: Emergency Medicine | Admitting: Emergency Medicine

## 2024-08-29 ENCOUNTER — Encounter (HOSPITAL_BASED_OUTPATIENT_CLINIC_OR_DEPARTMENT_OTHER): Payer: Self-pay | Admitting: Emergency Medicine

## 2024-08-29 ENCOUNTER — Other Ambulatory Visit: Payer: Self-pay

## 2024-08-29 DIAGNOSIS — J101 Influenza due to other identified influenza virus with other respiratory manifestations: Secondary | ICD-10-CM | POA: Diagnosis not present

## 2024-08-29 DIAGNOSIS — R059 Cough, unspecified: Secondary | ICD-10-CM | POA: Diagnosis present

## 2024-08-29 LAB — RESP PANEL BY RT-PCR (RSV, FLU A&B, COVID)  RVPGX2
Influenza A by PCR: POSITIVE — AB
Influenza B by PCR: NEGATIVE
Resp Syncytial Virus by PCR: NEGATIVE
SARS Coronavirus 2 by RT PCR: NEGATIVE

## 2024-08-29 MED ORDER — OSELTAMIVIR PHOSPHATE 75 MG PO CAPS: 75.0000 mg | ORAL_CAPSULE | Freq: Two times a day (BID) | ORAL | 0 refills | Status: AC

## 2024-08-29 NOTE — Discharge Instructions (Signed)
 Please read and follow all provided instructions.  Your diagnoses today include:  1. Influenza A     Tests performed today include: Flu, COVID, RSV testing: Positive for influenza type A Vital signs. See below for your results today.   Medications prescribed:  Tamiflu  - medication for influenza  This medication, when taken within the first 48 hours of illness, may help decrease the severity of the flu and cause symptoms to improve approximately 12 hours sooner. About 1 out of 5 patients may have diarrhea and vomiting from this medication.   Take any prescribed medications only as directed.  Home care instructions:  Follow any educational materials contained in this packet. Please continue drinking plenty of fluids. Use over-the-counter cold and flu medications as needed as directed on packaging for symptom relief. You may also use ibuprofen  or tylenol  as directed on packaging for pain or fever.   BE VERY CAREFUL not to take multiple medicines containing Tylenol  (also called acetaminophen ). Doing so can lead to an overdose which can damage your liver and cause liver failure and possibly death.   Follow-up instructions: Please follow-up with your primary care provider in the next 5 days for further evaluation of your symptoms if not improving.   Return instructions:  Please return to the Emergency Department if you experience worsening symptoms. Please return if you have a high fever greater than 101 degrees not controlled with over-the-counter medications, persistent vomiting and cannot keep down fluids, or worsening trouble breathing. Please return if you have any other emergent concerns.  Additional Information:  Your vital signs today were: BP (!) 140/99   Pulse (!) 104   Temp 98.7 F (37.1 C) (Oral)   Resp (!) 22   Wt 74.8 kg   LMP 08/24/2024 (Exact Date)   SpO2 100%   BMI 27.44 kg/m  If your blood pressure (BP) was elevated above 135/85 this visit, please have this  repeated by your doctor within one month.

## 2024-08-29 NOTE — ED Provider Notes (Signed)
 " [place] [facility] Provider Note   CSN: [id number] Arrival date & time: 08/29/24  9096     Patient presents with: Flu-Like Symptoms   Denise Tran is a 43 y.o. female.   Patient presents to the emergency department for evaluation of flulike illness.  Patient was exposed to a client with similar symptoms.  She reports body aches, chills, cough, sore throat, congestion starting about 48 hours ago.  She has had some vomiting and diarrhea with this, but not persistent.  Subjective fever.  Shortness of breath with cough.       Prior to Admission medications  Medication Sig Start Date End Date Taking? Authorizing Provider  oseltamivir (TAMIFLU) 75 MG capsule Take 1 capsule (75 mg total) by mouth every 12 (twelve) hours. 08/29/24  Yes Desiderio Chew, PA-C  cetirizine  (ZYRTEC  ALLERGY) 10 MG tablet Take 1 tablet (10 mg total) by mouth daily. 09/25/23   Bauer, Collin S, PA-C  predniSONE  (STERAPRED UNI-PAK 21 TAB) 10 MG (21) TBPK tablet 10mg  Tabs, 6 day taper. Use as directed 06/09/24   Roselyn Carlin NOVAK, MD    Allergies: Iodine and Shellfish allergy    Review of Systems  Updated Vital Signs BP (!) 140/99   Pulse (!) 104   Temp 98.7 F (37.1 C) (Oral)   Resp (!) 22   Wt 74.8 kg   LMP 08/24/2024 (Exact Date)   SpO2 100%   BMI 27.44 kg/m   Physical Exam Vitals and nursing note reviewed.  Constitutional:      Appearance: She is well-developed.  HENT:     Head: Normocephalic and atraumatic.     Jaw: No trismus.     Right Ear: Tympanic membrane, ear canal and external ear normal.     Left Ear: Tympanic membrane, ear canal and external ear normal.     Nose: Congestion present. No mucosal edema or rhinorrhea.     Mouth/Throat:     Mouth: Mucous membranes are moist. Mucous membranes are not dry. No oral lesions.     Pharynx: Uvula midline. No oropharyngeal exudate, posterior oropharyngeal erythema or uvula swelling.     Tonsils: No  tonsillar abscesses.  Eyes:     General:        Right eye: No discharge.        Left eye: No discharge.     Conjunctiva/sclera: Conjunctivae normal.  Cardiovascular:     Rate and Rhythm: Normal rate and regular rhythm.     Heart sounds: Normal heart sounds.  Pulmonary:     Effort: Pulmonary effort is normal. No respiratory distress.     Breath sounds: Normal breath sounds. No wheezing or rales.     Comments: Lungs clear to auscultation bilaterally. Abdominal:     Palpations: Abdomen is soft.     Tenderness: There is no abdominal tenderness.  Musculoskeletal:     Cervical back: Normal range of motion and neck supple.  Lymphadenopathy:     Cervical: No cervical adenopathy.  Skin:    General: Skin is warm and dry.  Neurological:     Mental Status: She is alert.  Psychiatric:        Mood and Affect: Mood normal.     (all labs ordered are listed, but only abnormal results are displayed) Labs Reviewed  RESP PANEL BY RT-PCR (RSV, FLU A&B, COVID)  RVPGX2 - Abnormal; Notable for the following components:      Result Value   Influenza A by PCR  POSITIVE (*)    All other components within normal limits    EKG: None  Radiology: No results found.   Procedures   Medications Ordered in the ED - No data to display  ED Course  Patient seen and examined. History obtained directly from patient. Work-up including labs, imaging, EKG ordered in triage, if performed, were reviewed.    Labs/EKG: Independently reviewed and interpreted.  This included: Flu, COVID, RSV was positive for influenza type A which is consistent with the patient's symptoms.  Imaging: None ordered  Medications/Fluids: None ordered  Most recent vital signs reviewed and are as follows: BP (!) 140/99   Pulse (!) 104   Temp 98.7 F (37.1 C) (Oral)   Resp (!) 22   Wt 74.8 kg   LMP 08/24/2024 (Exact Date)   SpO2 100%   BMI 27.44 kg/m   Initial impression: Influenza A  Home treatment plan: Discussed  risks/benefits of Tamiflu.  Patient would like a prescription.  Return instructions discussed with patient: Worsening shortness of breath or trouble breathing, persistent vomiting  Follow-up instructions discussed with patient: PCP in 5 days if not improving                                   Medical Decision Making Risk Prescription drug management.   Patient presents with flulike symptoms.  Testing positive for influenza.  Patient is well-appearing.  Vital signs are stable, fevers controlled.  No clinical signs and symptoms of significant dehydration and is currently tolerating fluids.  Considered secondary infection such as pneumonia, however lungs are clear on exam and patient without hypoxia.  Low concern for sepsis.  In addition, low clinical concern for mononucleosis.   Given well appearance, supportive therapy indicated currently.  Discussed signs and symptoms to return with patient at bedside.  Patient seems reliable to return as needed.       Final diagnoses:  Influenza A    ED Discharge Orders          Ordered    oseltamivir (TAMIFLU) 75 MG capsule  Every 12 hours        08/29/24 1042               Desiderio Chew, PA-C 08/29/24 1045    Ruthe Cornet, DO 08/29/24 1138  "

## 2024-08-29 NOTE — ED Triage Notes (Signed)
 Pt in with cough, sore throat and congestion x 2 days. 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 Reports a few episodes of emesis and diarrhea since yesterday.

## 2024-09-30 ENCOUNTER — Ambulatory Visit

## 2024-10-17 ENCOUNTER — Encounter: Payer: Self-pay | Admitting: Obstetrics and Gynecology

## 2024-10-17 ENCOUNTER — Ambulatory Visit: Admitting: Obstetrics and Gynecology

## 2024-10-17 VITALS — BP 118/82 | HR 89 | Ht 65.0 in | Wt 173.1 lb

## 2024-10-17 DIAGNOSIS — R102 Pelvic and perineal pain unspecified side: Secondary | ICD-10-CM

## 2024-10-17 DIAGNOSIS — Z1239 Encounter for other screening for malignant neoplasm of breast: Secondary | ICD-10-CM

## 2024-10-17 DIAGNOSIS — Z124 Encounter for screening for malignant neoplasm of cervix: Secondary | ICD-10-CM

## 2024-10-17 DIAGNOSIS — Z113 Encounter for screening for infections with a predominantly sexual mode of transmission: Secondary | ICD-10-CM

## 2024-10-17 DIAGNOSIS — Z3189 Encounter for other procreative management: Secondary | ICD-10-CM

## 2024-10-17 DIAGNOSIS — Z01419 Encounter for gynecological examination (general) (routine) without abnormal findings: Secondary | ICD-10-CM

## 2024-10-17 DIAGNOSIS — Z720 Tobacco use: Secondary | ICD-10-CM

## 2024-10-17 DIAGNOSIS — N898 Other specified noninflammatory disorders of vagina: Secondary | ICD-10-CM

## 2024-10-17 LAB — POCT URINALYSIS DIPSTICK
Bilirubin, UA: NEGATIVE
Glucose, UA: NEGATIVE
Leukocytes, UA: NEGATIVE
Nitrite, UA: NEGATIVE
Protein, UA: POSITIVE — AB
Spec Grav, UA: >=1.03 — AB
Urobilinogen, UA: 0.2 U/dL
pH, UA: 5

## 2024-10-17 LAB — POCT URINE PREGNANCY: Preg Test, Ur: NEGATIVE

## 2024-10-17 NOTE — Progress Notes (Signed)
 "  ANNUAL GYNECOLOGY VISIT Chief Complaint  Patient presents with   Routine Prenatal Visit    Annual with pap      Subjective:  Denise Tran is a 44 y.o. G0P0000 who presents for annual.  Reports she would like to become pregnant. Has been trying for several months without success. She reports regular periods. Reports smoking. No medical problems, taking MVI  Also reports pelvic pain with sex and vaginal discharge. Requests STI testing.  Gyn History: Patient's last menstrual period was 09/26/2024 (exact date). Sexually active: yes/no: Yes Contraception: no method History of STIs: Yes: none recent Last pap: No results found for: DIAGPAP, HPV, HPVHIGH History of abnormal pap: Yes: denies hx LEEP/cone/colposcopy Periods: regular  Last mammogram: due     The pregnancy intention screening data noted above was reviewed. Potential methods of contraception were discussed. The patient elected to proceed with No data recorded.       10/17/2024    9:18 AM  Depression screen PHQ 2/9  Decreased Interest 0  Down, Depressed, Hopeless 0  PHQ - 2 Score 0  Altered sleeping 0  Tired, decreased energy 0  Change in appetite 0  Feeling bad or failure about yourself  0  Trouble concentrating 0  Moving slowly or fidgety/restless 0  Suicidal thoughts 0  PHQ-9 Score 0        10/17/2024    9:18 AM  GAD 7 : Generalized Anxiety Score  Nervous, Anxious, on Edge 0  Control/stop worrying 0  Worry too much - different things 0  Trouble relaxing 0  Restless 0  Easily annoyed or irritable 0  Afraid - awful might happen 0  Total GAD 7 Score 0      OB History     Gravida  0   Para  0   Term  0   Preterm  0   AB  0   Living  0      SAB  0   IAB  0   Ectopic  0   Multiple  0   Live Births  0           No past medical history on file.  Past Surgical History:  Procedure Laterality Date   cyst removed from breast      Social History   Socioeconomic  History   Marital status: Single    Spouse name: Not on file   Number of children: Not on file   Years of education: Not on file   Highest education level: Not on file  Occupational History   Not on file  Tobacco Use   Smoking status: Every Day    Current packs/day: 0.50    Types: Cigarettes   Smokeless tobacco: Never  Vaping Use   Vaping status: Never Used  Substance and Sexual Activity   Alcohol use: No   Drug use: No   Sexual activity: Yes  Other Topics Concern   Not on file  Social History Narrative   Not on file   Social Drivers of Health   Tobacco Use: High Risk (08/29/2024)   Patient History    Smoking Tobacco Use: Every Day    Smokeless Tobacco Use: Never    Passive Exposure: Not on file  Financial Resource Strain: Not on file  Food Insecurity: Not on file  Transportation Needs: Not on file  Physical Activity: Not on file  Stress: Not on file  Social Connections: Not on file  Depression (EYV7-0): Low Risk (  10/17/2024)   Depression (PHQ2-9)    PHQ-2 Score: 0  Alcohol Screen: Not on file  Housing: Not on file  Utilities: Not on file  Health Literacy: Not on file    No family history on file.  Medications Ordered Prior to Encounter[1]  Allergies[2]   Objective:   Vitals:   10/17/24 0913  BP: 118/82  Pulse: 89  Weight: 173 lb 1.9 oz (78.5 kg)  Height: 5' 5 (1.651 m)   Physical Examination:   General appearance - well appearing, and in no distress  Mental status - alert, oriented to person, place, and time  Psych:  normal mood and affect  Skin - warm and dry, normal color, no suspicious lesions noted  Breasts - breasts appear normal, no suspicious masses, no skin or nipple changes or  axillary nodes  Abdomen - soft, nontender, nondistended, no masses or organomegaly  Pelvic -  VULVA: normal appearing vulva with no masses, tenderness or lesions   VAGINA: normal appearing vagina with normal color and discharge, no lesions   CERVIX: normal  appearing cervix without discharge or lesions, no CMT  Thin prep pap is done with HR HPV cotesting  UTERUS: uterus is felt to be normal size, shape, consistency and nontender   ADNEXA: No adnexal masses or tenderness noted.  Extremities:  No swelling or varicosities noted  Chaperone present for exam  Assessment and Plan:  1. Well woman exam with routine gynecological exam (Primary) Pap/HPV Mammo ordered STI  testing  2. Cervical cancer screening - Cytology - PAP  3. Encounter for breast cancer screening other than mammogram - MM 3D SCREENING MAMMOGRAM BILATERAL BREAST; Future  4. Routine screening for STI (sexually transmitted infection) - Cervicovaginal ancillary only( [place]) - RPR+HBsAg+HCVAb+...  5. Pelvic pain Normal clinical exam. UA/UPT neg today apart from small blood on UA- send for culture Pelvic ultrasound ordered - US  PELVIC COMPLETE WITH TRANSVAGINAL; Future - Cervicovaginal ancillary only( [place])  6. Vaginal discharge - Cervicovaginal ancillary only( [place])  7. Encounter for fertility planning Counseled patient that at her age, her chances of becoming pregnant spontaneously are low. Additionally tobacco use can negatively impact her fertility. Offered REI referral and she is interested in this - Ambulatory referral to Infertility  8. Tobacco use Counseled on quitting   Rollo ONEIDA Bring, MD, FACOG Obstetrician & Gynecologist, [facility] for [facility], [facility]     [1]  Current Outpatient Medications on File Prior to Visit  Medication Sig Dispense Refill   cetirizine  (ZYRTEC  ALLERGY) 10 MG tablet Take 1 tablet (10 mg total) by mouth daily. (Patient taking differently: Take 10 mg by mouth as needed.) 30 tablet 0   oseltamivir  (TAMIFLU ) 75 MG capsule Take 1 capsule (75 mg total) by mouth every 12 (twelve) hours. 10 capsule 0   predniSONE  (STERAPRED UNI-PAK 21 TAB) 10 MG (21) TBPK tablet 10mg  Tabs, 6  day taper. Use as directed (Patient not taking: Reported on 10/17/2024) 1 each 0   No current facility-administered medications on file prior to visit.  [2]  Allergies Allergen Reactions   Iodine Anaphylaxis   Shellfish Allergy Anaphylaxis   "

## 2024-10-17 NOTE — Progress Notes (Signed)
 pocPatient presents for Annual.  LMP: No LMP recorded.  Last pap: uncertain moved from [place]  Contraception: None Mammogram: Due, last mammogram: order placed  STD Screening: Accepts Flu Vaccine : Declines  CC:   Fun Fact:  Wants to be tested for pelvic inflammatory diease, wants to discuss conceiving

## 2024-10-18 ENCOUNTER — Ambulatory Visit (HOSPITAL_BASED_OUTPATIENT_CLINIC_OR_DEPARTMENT_OTHER)
